# Patient Record
Sex: Male | Born: 1940 | Race: White | Hispanic: No | Smoking: Former smoker
Health system: Southern US, Community
[De-identification: ages and names within clinical notes are randomized; demographics above are authoritative.]

## PROBLEM LIST (undated history)

## (undated) DIAGNOSIS — E785 Hyperlipidemia, unspecified: Secondary | ICD-10-CM

## (undated) DIAGNOSIS — F32A Depression, unspecified: Secondary | ICD-10-CM

## (undated) DIAGNOSIS — E039 Hypothyroidism, unspecified: Secondary | ICD-10-CM

## (undated) DIAGNOSIS — I10 Essential (primary) hypertension: Secondary | ICD-10-CM

## (undated) DIAGNOSIS — F419 Anxiety disorder, unspecified: Secondary | ICD-10-CM

## (undated) DIAGNOSIS — J449 Chronic obstructive pulmonary disease, unspecified: Secondary | ICD-10-CM

## (undated) DIAGNOSIS — Z8673 Personal history of transient ischemic attack (TIA), and cerebral infarction without residual deficits: Secondary | ICD-10-CM

## (undated) DIAGNOSIS — F329 Major depressive disorder, single episode, unspecified: Secondary | ICD-10-CM

## (undated) DIAGNOSIS — N400 Enlarged prostate without lower urinary tract symptoms: Secondary | ICD-10-CM

## (undated) DIAGNOSIS — E119 Type 2 diabetes mellitus without complications: Secondary | ICD-10-CM

## (undated) DIAGNOSIS — Z794 Long term (current) use of insulin: Secondary | ICD-10-CM

---

## 2005-03-01 ENCOUNTER — Ambulatory Visit: Payer: Self-pay | Admitting: Internal Medicine

## 2006-01-05 ENCOUNTER — Ambulatory Visit: Payer: Self-pay | Admitting: Internal Medicine

## 2006-01-17 ENCOUNTER — Ambulatory Visit: Payer: Self-pay | Admitting: *Deleted

## 2006-01-30 ENCOUNTER — Ambulatory Visit: Payer: Self-pay | Admitting: Internal Medicine

## 2006-02-07 ENCOUNTER — Ambulatory Visit: Payer: Self-pay | Admitting: *Deleted

## 2006-03-07 ENCOUNTER — Ambulatory Visit: Payer: Self-pay | Admitting: *Deleted

## 2009-10-31 ENCOUNTER — Inpatient Hospital Stay: Payer: Self-pay | Admitting: Internal Medicine

## 2009-11-08 ENCOUNTER — Encounter: Payer: Self-pay | Admitting: Internal Medicine

## 2009-12-02 ENCOUNTER — Encounter: Payer: Self-pay | Admitting: Internal Medicine

## 2009-12-05 ENCOUNTER — Ambulatory Visit: Payer: Self-pay | Admitting: Internal Medicine

## 2010-03-26 ENCOUNTER — Encounter: Payer: Self-pay | Admitting: Internal Medicine

## 2010-04-01 ENCOUNTER — Encounter: Payer: Self-pay | Admitting: Internal Medicine

## 2010-05-01 ENCOUNTER — Encounter: Payer: Self-pay | Admitting: Internal Medicine

## 2010-05-25 ENCOUNTER — Ambulatory Visit: Payer: Self-pay | Admitting: Internal Medicine

## 2010-06-01 ENCOUNTER — Ambulatory Visit: Payer: Self-pay | Admitting: Vascular Surgery

## 2010-06-02 ENCOUNTER — Encounter: Payer: Self-pay | Admitting: Internal Medicine

## 2010-06-10 ENCOUNTER — Ambulatory Visit: Payer: Self-pay | Admitting: Internal Medicine

## 2010-06-11 ENCOUNTER — Ambulatory Visit: Payer: Self-pay | Admitting: Internal Medicine

## 2010-06-25 ENCOUNTER — Ambulatory Visit: Payer: Self-pay | Admitting: Internal Medicine

## 2010-07-02 ENCOUNTER — Encounter: Payer: Self-pay | Admitting: Internal Medicine

## 2010-07-09 ENCOUNTER — Ambulatory Visit: Payer: Self-pay | Admitting: Internal Medicine

## 2010-07-23 ENCOUNTER — Ambulatory Visit: Payer: Self-pay | Admitting: Internal Medicine

## 2010-08-03 ENCOUNTER — Ambulatory Visit: Payer: Self-pay | Admitting: Internal Medicine

## 2010-08-04 ENCOUNTER — Encounter: Payer: Self-pay | Admitting: Internal Medicine

## 2010-08-10 ENCOUNTER — Ambulatory Visit: Payer: Self-pay | Admitting: Internal Medicine

## 2010-08-17 ENCOUNTER — Ambulatory Visit: Payer: Self-pay | Admitting: Internal Medicine

## 2010-08-27 ENCOUNTER — Ambulatory Visit: Payer: Self-pay | Admitting: Internal Medicine

## 2010-09-01 ENCOUNTER — Encounter: Payer: Self-pay | Admitting: Internal Medicine

## 2010-09-17 ENCOUNTER — Ambulatory Visit: Payer: Self-pay | Admitting: Internal Medicine

## 2010-10-01 ENCOUNTER — Encounter: Payer: Self-pay | Admitting: Internal Medicine

## 2010-10-08 ENCOUNTER — Encounter: Payer: Self-pay | Admitting: Internal Medicine

## 2010-11-01 ENCOUNTER — Encounter: Payer: Self-pay | Admitting: Internal Medicine

## 2010-12-02 ENCOUNTER — Encounter: Payer: Self-pay | Admitting: Internal Medicine

## 2010-12-31 ENCOUNTER — Encounter: Payer: Self-pay | Admitting: Internal Medicine

## 2011-01-31 ENCOUNTER — Encounter: Payer: Self-pay | Admitting: Internal Medicine

## 2011-03-02 ENCOUNTER — Encounter: Payer: Self-pay | Admitting: Internal Medicine

## 2011-04-02 ENCOUNTER — Encounter: Payer: Self-pay | Admitting: Internal Medicine

## 2011-05-02 ENCOUNTER — Encounter: Payer: Self-pay | Admitting: Internal Medicine

## 2011-06-02 ENCOUNTER — Encounter: Payer: Self-pay | Admitting: Internal Medicine

## 2011-07-03 ENCOUNTER — Encounter: Payer: Self-pay | Admitting: Internal Medicine

## 2011-08-02 ENCOUNTER — Encounter: Payer: Self-pay | Admitting: Internal Medicine

## 2011-09-02 ENCOUNTER — Encounter: Payer: Self-pay | Admitting: Internal Medicine

## 2011-10-02 ENCOUNTER — Encounter: Payer: Self-pay | Admitting: Internal Medicine

## 2011-11-02 ENCOUNTER — Encounter: Payer: Self-pay | Admitting: Internal Medicine

## 2011-12-03 ENCOUNTER — Encounter: Payer: Self-pay | Admitting: Internal Medicine

## 2011-12-31 ENCOUNTER — Encounter: Payer: Self-pay | Admitting: Internal Medicine

## 2012-01-31 ENCOUNTER — Encounter: Payer: Self-pay | Admitting: Internal Medicine

## 2012-03-01 ENCOUNTER — Encounter: Payer: Self-pay | Admitting: Internal Medicine

## 2012-04-01 ENCOUNTER — Encounter: Payer: Self-pay | Admitting: Internal Medicine

## 2012-04-11 LAB — BASIC METABOLIC PANEL
Anion Gap: 4 — ABNORMAL LOW (ref 7–16)
BUN: 12 mg/dL (ref 7–18)
Calcium, Total: 8.9 mg/dL (ref 8.5–10.1)
Chloride: 100 mmol/L (ref 98–107)
Co2: 33 mmol/L — ABNORMAL HIGH (ref 21–32)
EGFR (Non-African Amer.): >60
Glucose: 120 mg/dL — ABNORMAL HIGH (ref 65–99)
Osmolality: 275 (ref 275–301)
Potassium: 3.9 mmol/L (ref 3.5–5.1)

## 2012-04-11 LAB — HEPATIC FUNCTION PANEL A (ARMC)
Albumin: 3.6 g/dL (ref 3.4–5.0)
Alkaline Phosphatase: 88 U/L (ref 50–136)
Bilirubin, Direct: 0.1 mg/dL (ref 0.00–0.20)
Bilirubin,Total: 0.6 mg/dL (ref 0.2–1.0)
SGOT(AST): 27 U/L (ref 15–37)

## 2012-04-11 LAB — LIPID PANEL
Cholesterol: 140 mg/dL (ref 0–200)
HDL Cholesterol: 24 mg/dL — ABNORMAL LOW (ref 40–60)
VLDL Cholesterol, Calc: 44 mg/dL — ABNORMAL HIGH (ref 5–40)

## 2012-04-12 LAB — URINALYSIS, COMPLETE
Hyaline Cast: 31
Leukocyte Esterase: NEGATIVE
Nitrite: NEGATIVE
Protein: 100
WBC UR: 7 /HPF (ref 0–5)

## 2012-04-14 LAB — URINE CULTURE

## 2012-05-01 ENCOUNTER — Encounter: Payer: Self-pay | Admitting: Internal Medicine

## 2012-06-01 ENCOUNTER — Encounter: Payer: Self-pay | Admitting: Internal Medicine

## 2012-07-02 ENCOUNTER — Encounter: Payer: Self-pay | Admitting: Internal Medicine

## 2012-08-01 ENCOUNTER — Encounter: Payer: Self-pay | Admitting: Internal Medicine

## 2012-09-01 ENCOUNTER — Encounter: Payer: Self-pay | Admitting: Internal Medicine

## 2012-10-01 ENCOUNTER — Encounter: Payer: Self-pay | Admitting: Internal Medicine

## 2012-10-10 LAB — HEMOGLOBIN A1C: Hemoglobin A1C: 7.9 % — ABNORMAL HIGH (ref 4.2–6.3)

## 2012-10-10 LAB — BASIC METABOLIC PANEL
Calcium, Total: 8.4 mg/dL — ABNORMAL LOW (ref 8.5–10.1)
Co2: 29 mmol/L (ref 21–32)
EGFR (African American): >60
EGFR (Non-African Amer.): >60
Glucose: 158 mg/dL — ABNORMAL HIGH (ref 65–99)
Osmolality: 282 (ref 275–301)
Sodium: 139 mmol/L (ref 136–145)

## 2012-10-10 LAB — LIPID PANEL
HDL Cholesterol: 19 mg/dL — ABNORMAL LOW (ref 40–60)
Triglycerides: 200 mg/dL (ref 0–200)

## 2012-11-01 ENCOUNTER — Encounter: Payer: Self-pay | Admitting: Internal Medicine

## 2012-12-02 ENCOUNTER — Encounter: Payer: Self-pay | Admitting: Internal Medicine

## 2012-12-30 ENCOUNTER — Encounter: Payer: Self-pay | Admitting: Internal Medicine

## 2013-01-25 LAB — HEPATIC FUNCTION PANEL A (ARMC)
Albumin: 3.1 g/dL — ABNORMAL LOW (ref 3.4–5.0)
Alkaline Phosphatase: 80 U/L (ref 50–136)
Bilirubin, Direct: <0.1 mg/dL (ref 0.00–0.20)
SGOT(AST): 30 U/L (ref 15–37)
SGPT (ALT): 35 U/L (ref 12–78)

## 2013-01-25 LAB — LIPID PANEL
Cholesterol: 140 mg/dL (ref 0–200)
HDL Cholesterol: 21 mg/dL — ABNORMAL LOW (ref 40–60)
Ldl Cholesterol, Calc: 69 mg/dL (ref 0–100)

## 2013-01-25 LAB — BASIC METABOLIC PANEL
BUN: 13 mg/dL (ref 7–18)
Chloride: 100 mmol/L (ref 98–107)
Creatinine: 0.87 mg/dL (ref 0.60–1.30)
Glucose: 198 mg/dL — ABNORMAL HIGH (ref 65–99)

## 2013-01-30 ENCOUNTER — Encounter: Payer: Self-pay | Admitting: Internal Medicine

## 2013-03-01 ENCOUNTER — Encounter: Payer: Self-pay | Admitting: Internal Medicine

## 2013-04-01 ENCOUNTER — Encounter: Payer: Self-pay | Admitting: Internal Medicine

## 2013-04-26 LAB — LIPID PANEL
HDL Cholesterol: 24 mg/dL — ABNORMAL LOW (ref 40–60)
Ldl Cholesterol, Calc: 81 mg/dL (ref 0–100)
Triglycerides: 155 mg/dL (ref 0–200)

## 2013-04-26 LAB — SGOT (AST)(ARMC): SGOT(AST): 19 U/L (ref 15–37)

## 2013-05-01 ENCOUNTER — Encounter: Payer: Self-pay | Admitting: Internal Medicine

## 2013-06-01 ENCOUNTER — Encounter: Payer: Self-pay | Admitting: Internal Medicine

## 2013-07-02 ENCOUNTER — Encounter: Payer: Self-pay | Admitting: Internal Medicine

## 2013-07-12 LAB — BASIC METABOLIC PANEL
Anion Gap: 6 — ABNORMAL LOW (ref 7–16)
BUN: 17 mg/dL (ref 7–18)
Calcium, Total: 9 mg/dL (ref 8.5–10.1)
Chloride: 99 mmol/L (ref 98–107)
Co2: 29 mmol/L (ref 21–32)
EGFR (African American): >60
Potassium: 4 mmol/L (ref 3.5–5.1)
Sodium: 134 mmol/L — ABNORMAL LOW (ref 136–145)

## 2013-08-01 ENCOUNTER — Encounter: Payer: Self-pay | Admitting: Internal Medicine

## 2013-09-01 ENCOUNTER — Encounter: Payer: Self-pay | Admitting: Internal Medicine

## 2013-10-01 ENCOUNTER — Encounter: Payer: Self-pay | Admitting: Internal Medicine

## 2013-10-29 LAB — URINALYSIS, COMPLETE
Bilirubin,UR: NEGATIVE
Hyaline Cast: 3
Ketone: NEGATIVE
Nitrite: NEGATIVE
Protein: 100
WBC UR: 3 /HPF (ref 0–5)

## 2013-10-31 LAB — URINE CULTURE

## 2013-11-01 ENCOUNTER — Encounter: Payer: Self-pay | Admitting: Internal Medicine

## 2013-11-08 LAB — LIPID PANEL
Cholesterol: 159 mg/dL (ref 0–200)
HDL Cholesterol: 25 mg/dL — ABNORMAL LOW (ref 40–60)
LDL CHOLESTEROL, CALC: 92 mg/dL (ref 0–100)
TRIGLYCERIDES: 211 mg/dL — AB (ref 0–200)
VLDL Cholesterol, Calc: 42 mg/dL — ABNORMAL HIGH (ref 5–40)

## 2013-11-08 LAB — BASIC METABOLIC PANEL
Anion Gap: 1 — ABNORMAL LOW (ref 7–16)
BUN: 15 mg/dL (ref 7–18)
CALCIUM: 9 mg/dL (ref 8.5–10.1)
CHLORIDE: 100 mmol/L (ref 98–107)
Co2: 31 mmol/L (ref 21–32)
Creatinine: 0.96 mg/dL (ref 0.60–1.30)
EGFR (African American): >60
EGFR (Non-African Amer.): >60
GLUCOSE: 175 mg/dL — AB (ref 65–99)
OSMOLALITY: 270 (ref 275–301)
Potassium: 4.4 mmol/L (ref 3.5–5.1)
Sodium: 132 mmol/L — ABNORMAL LOW (ref 136–145)

## 2013-11-08 LAB — HEMOGLOBIN A1C: HEMOGLOBIN A1C: 7.5 % — AB (ref 4.2–6.3)

## 2013-12-02 ENCOUNTER — Encounter: Payer: Self-pay | Admitting: Internal Medicine

## 2013-12-30 ENCOUNTER — Encounter: Payer: Self-pay | Admitting: Internal Medicine

## 2014-01-30 ENCOUNTER — Encounter: Payer: Self-pay | Admitting: Internal Medicine

## 2014-03-01 ENCOUNTER — Encounter: Payer: Self-pay | Admitting: Internal Medicine

## 2014-04-01 ENCOUNTER — Encounter: Payer: Self-pay | Admitting: Internal Medicine

## 2014-05-01 ENCOUNTER — Encounter: Payer: Self-pay | Admitting: Internal Medicine

## 2014-05-09 LAB — BASIC METABOLIC PANEL
Anion Gap: 7 (ref 7–16)
BUN: 15 mg/dL (ref 7–18)
CHLORIDE: 98 mmol/L (ref 98–107)
CREATININE: 1.03 mg/dL (ref 0.60–1.30)
Calcium, Total: 8.8 mg/dL (ref 8.5–10.1)
Co2: 31 mmol/L (ref 21–32)
EGFR (African American): >60
EGFR (Non-African Amer.): >60
Glucose: 194 mg/dL — ABNORMAL HIGH (ref 65–99)
OSMOLALITY: 278 (ref 275–301)
Potassium: 3.9 mmol/L (ref 3.5–5.1)
Sodium: 136 mmol/L (ref 136–145)

## 2014-05-09 LAB — LIPID PANEL
Cholesterol: 154 mg/dL (ref 0–200)
HDL: 21 mg/dL — AB (ref 40–60)
Ldl Cholesterol, Calc: 93 mg/dL (ref 0–100)
Triglycerides: 199 mg/dL (ref 0–200)
VLDL Cholesterol, Calc: 40 mg/dL (ref 5–40)

## 2014-05-09 LAB — HEMOGLOBIN A1C: Hemoglobin A1C: 7.7 % — ABNORMAL HIGH (ref 4.2–6.3)

## 2014-05-14 LAB — BASIC METABOLIC PANEL
ANION GAP: 6 — AB (ref 7–16)
BUN: 14 mg/dL (ref 7–18)
CALCIUM: 8.5 mg/dL (ref 8.5–10.1)
CHLORIDE: 101 mmol/L (ref 98–107)
CREATININE: 0.99 mg/dL (ref 0.60–1.30)
Co2: 29 mmol/L (ref 21–32)
EGFR (African American): >60
EGFR (Non-African Amer.): >60
GLUCOSE: 182 mg/dL — AB (ref 65–99)
Osmolality: 277 (ref 275–301)
Potassium: 3.9 mmol/L (ref 3.5–5.1)
SODIUM: 136 mmol/L (ref 136–145)

## 2014-05-14 LAB — LIPID PANEL
Cholesterol: 139 mg/dL (ref 0–200)
HDL: 21 mg/dL — AB (ref 40–60)
Ldl Cholesterol, Calc: 83 mg/dL (ref 0–100)
Triglycerides: 173 mg/dL (ref 0–200)
VLDL Cholesterol, Calc: 35 mg/dL (ref 5–40)

## 2014-05-14 LAB — HEMOGLOBIN A1C: HEMOGLOBIN A1C: 8.3 % — AB (ref 4.2–6.3)

## 2014-06-01 ENCOUNTER — Encounter: Payer: Self-pay | Admitting: Internal Medicine

## 2014-07-02 ENCOUNTER — Encounter: Payer: Self-pay | Admitting: Internal Medicine

## 2014-08-01 ENCOUNTER — Encounter: Payer: Self-pay | Admitting: Internal Medicine

## 2014-09-01 ENCOUNTER — Encounter: Payer: Self-pay | Admitting: Internal Medicine

## 2014-10-01 ENCOUNTER — Encounter: Payer: Self-pay | Admitting: Internal Medicine

## 2014-11-01 ENCOUNTER — Encounter: Payer: Self-pay | Admitting: Internal Medicine

## 2014-11-14 LAB — COMPREHENSIVE METABOLIC PANEL
Albumin: 2.9 g/dL — ABNORMAL LOW (ref 3.4–5.0)
Alkaline Phosphatase: 72 U/L
Anion Gap: 6 — ABNORMAL LOW (ref 7–16)
BILIRUBIN TOTAL: 0.5 mg/dL (ref 0.2–1.0)
BUN: 19 mg/dL — ABNORMAL HIGH (ref 7–18)
CALCIUM: 8.5 mg/dL (ref 8.5–10.1)
CHLORIDE: 104 mmol/L (ref 98–107)
CO2: 29 mmol/L (ref 21–32)
CREATININE: 1.14 mg/dL (ref 0.60–1.30)
EGFR (African American): >60
EGFR (Non-African Amer.): >60
Glucose: 171 mg/dL — ABNORMAL HIGH (ref 65–99)
OSMOLALITY: 284 (ref 275–301)
POTASSIUM: 3.8 mmol/L (ref 3.5–5.1)
SGOT(AST): 56 U/L — ABNORMAL HIGH (ref 15–37)
SGPT (ALT): 40 U/L
SODIUM: 139 mmol/L (ref 136–145)
TOTAL PROTEIN: 6.5 g/dL (ref 6.4–8.2)

## 2014-11-14 LAB — CBC WITH DIFFERENTIAL/PLATELET
BASOS PCT: 0.7 %
Basophil #: 0 10*3/uL (ref 0.0–0.1)
EOS PCT: 7.2 %
Eosinophil #: 0.5 10*3/uL (ref 0.0–0.7)
HCT: 41.4 % (ref 40.0–52.0)
HGB: 13.5 g/dL (ref 13.0–18.0)
LYMPHS PCT: 20.1 %
Lymphocyte #: 1.4 10*3/uL (ref 1.0–3.6)
MCH: 27.3 pg (ref 26.0–34.0)
MCHC: 32.5 g/dL (ref 32.0–36.0)
MCV: 84 fL (ref 80–100)
MONOS PCT: 5 %
Monocyte #: 0.3 x10 3/mm (ref 0.2–1.0)
Neutrophil #: 4.5 10*3/uL (ref 1.4–6.5)
Neutrophil %: 67 %
Platelet: 185 10*3/uL (ref 150–440)
RBC: 4.94 10*6/uL (ref 4.40–5.90)
RDW: 14.1 % (ref 11.5–14.5)
WBC: 6.7 10*3/uL (ref 3.8–10.6)

## 2014-11-14 LAB — TSH: Thyroid Stimulating Horm: 3.52 u[IU]/mL

## 2014-11-14 LAB — LIPID PANEL
Cholesterol: 128 mg/dL (ref 0–200)
HDL: 26 mg/dL — AB (ref 40–60)
LDL CHOLESTEROL, CALC: 82 mg/dL (ref 0–100)
Triglycerides: 102 mg/dL (ref 0–200)
VLDL Cholesterol, Calc: 20 mg/dL (ref 5–40)

## 2014-11-14 LAB — HEMOGLOBIN A1C: Hemoglobin A1C: 7.5 % — ABNORMAL HIGH (ref 4.2–6.3)

## 2014-12-02 ENCOUNTER — Encounter: Payer: Self-pay | Admitting: Internal Medicine

## 2014-12-31 ENCOUNTER — Encounter
Admit: 2014-12-31 | Disposition: A | Discharge status: Home / Self Care | Payer: Self-pay | Attending: Internal Medicine | Admitting: Internal Medicine

## 2015-01-31 ENCOUNTER — Encounter
Admit: 2015-01-31 | Disposition: A | Discharge status: Home / Self Care | Payer: Self-pay | Attending: Internal Medicine | Admitting: Internal Medicine

## 2015-02-11 LAB — COMPREHENSIVE METABOLIC PANEL
AST: 30 U/L
Albumin: 3.4 g/dL — ABNORMAL LOW
Alkaline Phosphatase: 50 U/L
Anion Gap: 4 — ABNORMAL LOW (ref 7–16)
BUN: 20 mg/dL
Bilirubin,Total: 0.5 mg/dL
CALCIUM: 8.4 mg/dL — AB
CHLORIDE: 106 mmol/L
CREATININE: 1.2 mg/dL
Co2: 29 mmol/L
EGFR (African American): >60
GFR CALC NON AF AMER: 59 — AB
GLUCOSE: 140 mg/dL — AB
POTASSIUM: 4 mmol/L
SGPT (ALT): 19 U/L
SODIUM: 139 mmol/L
Total Protein: 6.4 g/dL — ABNORMAL LOW

## 2015-02-11 LAB — CBC WITH DIFFERENTIAL/PLATELET
Basophil #: 0 10*3/uL (ref 0.0–0.1)
Basophil %: 0.5 %
EOS ABS: 0.7 10*3/uL (ref 0.0–0.7)
Eosinophil %: 11.3 %
HCT: 37.3 % — ABNORMAL LOW (ref 40.0–52.0)
HGB: 12.2 g/dL — AB (ref 13.0–18.0)
Lymphocyte #: 1.7 10*3/uL (ref 1.0–3.6)
Lymphocyte %: 27.4 %
MCH: 25.9 pg — AB (ref 26.0–34.0)
MCHC: 32.7 g/dL (ref 32.0–36.0)
MCV: 79 fL — ABNORMAL LOW (ref 80–100)
Monocyte #: 0.4 x10 3/mm (ref 0.2–1.0)
Monocyte %: 5.7 %
NEUTROS ABS: 3.4 10*3/uL (ref 1.4–6.5)
NEUTROS PCT: 55.1 %
Platelet: 190 10*3/uL (ref 150–440)
RBC: 4.71 10*6/uL (ref 4.40–5.90)
RDW: 14 % (ref 11.5–14.5)
WBC: 6.2 10*3/uL (ref 3.8–10.6)

## 2015-02-11 LAB — TSH: Thyroid Stimulating Horm: 5.337 u[IU]/mL — ABNORMAL HIGH

## 2015-02-18 LAB — BASIC METABOLIC PANEL
ANION GAP: 6 — AB (ref 7–16)
BUN: 22 mg/dL — ABNORMAL HIGH
CALCIUM: 7.7 mg/dL — AB
Chloride: 103 mmol/L
Co2: 28 mmol/L
Creatinine: 1.23 mg/dL
EGFR (African American): >60
GFR CALC NON AF AMER: 57 — AB
Glucose: 191 mg/dL — ABNORMAL HIGH
Potassium: 4.1 mmol/L
SODIUM: 137 mmol/L

## 2015-03-02 ENCOUNTER — Encounter
Admission: RE | Admit: 2015-03-02 | Discharge: 2015-03-02 | Disposition: A | Discharge status: Home / Self Care | Payer: Medicare Other | Source: Ambulatory Visit | Attending: Internal Medicine | Admitting: Internal Medicine

## 2015-03-02 DIAGNOSIS — D649 Anemia, unspecified: Secondary | ICD-10-CM | POA: Insufficient documentation

## 2015-03-02 DIAGNOSIS — E039 Hypothyroidism, unspecified: Secondary | ICD-10-CM | POA: Insufficient documentation

## 2015-03-27 DIAGNOSIS — E039 Hypothyroidism, unspecified: Secondary | ICD-10-CM | POA: Diagnosis not present

## 2015-03-27 DIAGNOSIS — D649 Anemia, unspecified: Secondary | ICD-10-CM | POA: Diagnosis not present

## 2015-03-27 LAB — CBC WITH DIFFERENTIAL/PLATELET
BASOS ABS: 0.1 10*3/uL (ref 0–0.1)
BASOS PCT: 1 %
EOS ABS: 0.8 10*3/uL — AB (ref 0–0.7)
EOS PCT: 11 %
HCT: 39.2 % — ABNORMAL LOW (ref 40.0–52.0)
HEMOGLOBIN: 12.8 g/dL — AB (ref 13.0–18.0)
LYMPHS ABS: 1.8 10*3/uL (ref 1.0–3.6)
LYMPHS PCT: 28 %
MCH: 25.5 pg — AB (ref 26.0–34.0)
MCHC: 32.7 g/dL (ref 32.0–36.0)
MCV: 78 fL — ABNORMAL LOW (ref 80.0–100.0)
Monocytes Absolute: 0.3 10*3/uL (ref 0.2–1.0)
Monocytes Relative: 5 %
NEUTROS PCT: 55 %
Neutro Abs: 3.6 10*3/uL (ref 1.4–6.5)
PLATELETS: 169 10*3/uL (ref 150–440)
RBC: 5.03 MIL/uL (ref 4.40–5.90)
RDW: 14.8 % — ABNORMAL HIGH (ref 11.5–14.5)
WBC: 6.6 10*3/uL (ref 3.8–10.6)

## 2015-03-27 LAB — TSH: TSH: 3.454 u[IU]/mL (ref 0.350–4.500)

## 2015-04-02 ENCOUNTER — Encounter
Admission: RE | Admit: 2015-04-02 | Discharge: 2015-04-02 | Disposition: A | Discharge status: Home / Self Care | Payer: Medicare Other | Source: Ambulatory Visit | Attending: Internal Medicine | Admitting: Internal Medicine

## 2015-05-02 ENCOUNTER — Encounter
Admission: RE | Admit: 2015-05-02 | Discharge: 2015-05-02 | Disposition: A | Discharge status: Home / Self Care | Payer: Medicare Other | Source: Ambulatory Visit | Attending: Internal Medicine | Admitting: Internal Medicine

## 2015-05-02 DIAGNOSIS — E039 Hypothyroidism, unspecified: Secondary | ICD-10-CM | POA: Insufficient documentation

## 2015-05-02 DIAGNOSIS — D649 Anemia, unspecified: Secondary | ICD-10-CM | POA: Insufficient documentation

## 2015-06-02 ENCOUNTER — Encounter
Admission: RE | Admit: 2015-06-02 | Discharge: 2015-06-02 | Disposition: A | Discharge status: Home / Self Care | Payer: Medicare Other | Source: Ambulatory Visit | Attending: Internal Medicine | Admitting: Internal Medicine

## 2015-06-19 LAB — GLUCOSE, CAPILLARY: GLUCOSE-CAPILLARY: 221 mg/dL — AB (ref 65–99)

## 2015-06-20 LAB — GLUCOSE, CAPILLARY: Glucose-Capillary: 276 mg/dL — ABNORMAL HIGH (ref 65–99)

## 2015-06-24 LAB — GLUCOSE, CAPILLARY: Glucose-Capillary: 186 mg/dL — ABNORMAL HIGH (ref 65–99)

## 2015-06-25 LAB — GLUCOSE, CAPILLARY: GLUCOSE-CAPILLARY: 338 mg/dL — AB (ref 65–99)

## 2015-06-28 LAB — GLUCOSE, CAPILLARY: GLUCOSE-CAPILLARY: 281 mg/dL — AB (ref 65–99)

## 2015-06-29 LAB — GLUCOSE, CAPILLARY: GLUCOSE-CAPILLARY: 296 mg/dL — AB (ref 65–99)

## 2015-06-30 LAB — GLUCOSE, CAPILLARY
Glucose-Capillary: 154 mg/dL — ABNORMAL HIGH (ref 65–99)
Glucose-Capillary: 314 mg/dL — ABNORMAL HIGH (ref 65–99)

## 2015-07-02 LAB — GLUCOSE, CAPILLARY
Glucose-Capillary: 154 mg/dL — ABNORMAL HIGH (ref 65–99)
Glucose-Capillary: 275 mg/dL — ABNORMAL HIGH (ref 65–99)

## 2015-07-03 ENCOUNTER — Encounter
Admission: RE | Admit: 2015-07-03 | Discharge: 2015-07-03 | Disposition: A | Discharge status: Home / Self Care | Payer: Medicare Other | Source: Ambulatory Visit | Attending: Internal Medicine | Admitting: Internal Medicine

## 2015-07-03 DIAGNOSIS — D649 Anemia, unspecified: Secondary | ICD-10-CM | POA: Insufficient documentation

## 2015-07-03 DIAGNOSIS — E039 Hypothyroidism, unspecified: Secondary | ICD-10-CM | POA: Insufficient documentation

## 2015-07-03 LAB — GLUCOSE, CAPILLARY: Glucose-Capillary: 191 mg/dL — ABNORMAL HIGH (ref 65–99)

## 2015-07-04 LAB — GLUCOSE, CAPILLARY
Glucose-Capillary: 198 mg/dL — ABNORMAL HIGH (ref 65–99)
Glucose-Capillary: 416 mg/dL — ABNORMAL HIGH (ref 65–99)

## 2015-07-05 LAB — GLUCOSE, CAPILLARY: Glucose-Capillary: 295 mg/dL — ABNORMAL HIGH (ref 65–99)

## 2015-07-06 LAB — GLUCOSE, CAPILLARY: Glucose-Capillary: 315 mg/dL — ABNORMAL HIGH (ref 65–99)

## 2015-07-07 LAB — GLUCOSE, CAPILLARY: Glucose-Capillary: 364 mg/dL — ABNORMAL HIGH (ref 65–99)

## 2015-07-08 LAB — GLUCOSE, CAPILLARY
Glucose-Capillary: 333 mg/dL — ABNORMAL HIGH (ref 65–99)
Glucose-Capillary: 354 mg/dL — ABNORMAL HIGH (ref 65–99)

## 2015-07-09 LAB — GLUCOSE, CAPILLARY
GLUCOSE-CAPILLARY: 308 mg/dL — AB (ref 65–99)
Glucose-Capillary: 276 mg/dL — ABNORMAL HIGH (ref 65–99)

## 2015-07-10 LAB — GLUCOSE, CAPILLARY: Glucose-Capillary: 282 mg/dL — ABNORMAL HIGH (ref 65–99)

## 2015-07-11 LAB — GLUCOSE, CAPILLARY: GLUCOSE-CAPILLARY: 356 mg/dL — AB (ref 65–99)

## 2015-07-12 LAB — GLUCOSE, CAPILLARY
GLUCOSE-CAPILLARY: 227 mg/dL — AB (ref 65–99)
GLUCOSE-CAPILLARY: 122 mg/dL — AB (ref 65–99)

## 2015-07-13 LAB — GLUCOSE, CAPILLARY
Glucose-Capillary: 309 mg/dL — ABNORMAL HIGH (ref 65–99)
Glucose-Capillary: 242 mg/dL — ABNORMAL HIGH (ref 65–99)

## 2015-07-14 LAB — GLUCOSE, CAPILLARY
GLUCOSE-CAPILLARY: 205 mg/dL — AB (ref 65–99)
GLUCOSE-CAPILLARY: 351 mg/dL — AB (ref 65–99)

## 2015-07-15 LAB — GLUCOSE, CAPILLARY: GLUCOSE-CAPILLARY: 291 mg/dL — AB (ref 65–99)

## 2015-07-16 LAB — GLUCOSE, CAPILLARY: GLUCOSE-CAPILLARY: 323 mg/dL — AB (ref 65–99)

## 2015-07-17 LAB — GLUCOSE, CAPILLARY
GLUCOSE-CAPILLARY: 237 mg/dL — AB (ref 65–99)
GLUCOSE-CAPILLARY: 282 mg/dL — AB (ref 65–99)

## 2015-07-18 LAB — GLUCOSE, CAPILLARY
GLUCOSE-CAPILLARY: 248 mg/dL — AB (ref 65–99)
Glucose-Capillary: 295 mg/dL — ABNORMAL HIGH (ref 65–99)

## 2015-07-19 LAB — GLUCOSE, CAPILLARY: GLUCOSE-CAPILLARY: 255 mg/dL — AB (ref 65–99)

## 2015-07-20 LAB — GLUCOSE, CAPILLARY: Glucose-Capillary: 329 mg/dL — ABNORMAL HIGH (ref 65–99)

## 2015-07-22 LAB — GLUCOSE, CAPILLARY
GLUCOSE-CAPILLARY: 195 mg/dL — AB (ref 65–99)
Glucose-Capillary: 155 mg/dL — ABNORMAL HIGH (ref 65–99)

## 2015-07-23 LAB — GLUCOSE, CAPILLARY: GLUCOSE-CAPILLARY: 206 mg/dL — AB (ref 65–99)

## 2015-07-24 LAB — GLUCOSE, CAPILLARY: Glucose-Capillary: 306 mg/dL — ABNORMAL HIGH (ref 65–99)

## 2015-07-27 LAB — GLUCOSE, CAPILLARY
GLUCOSE-CAPILLARY: 274 mg/dL — AB (ref 65–99)
GLUCOSE-CAPILLARY: 176 mg/dL — AB (ref 65–99)
Glucose-Capillary: 150 mg/dL — ABNORMAL HIGH (ref 65–99)
Glucose-Capillary: 180 mg/dL — ABNORMAL HIGH (ref 65–99)

## 2015-07-28 LAB — GLUCOSE, CAPILLARY
GLUCOSE-CAPILLARY: 150 mg/dL — AB (ref 65–99)
GLUCOSE-CAPILLARY: 284 mg/dL — AB (ref 65–99)

## 2015-07-29 LAB — GLUCOSE, CAPILLARY: GLUCOSE-CAPILLARY: 228 mg/dL — AB (ref 65–99)

## 2015-07-30 LAB — GLUCOSE, CAPILLARY: GLUCOSE-CAPILLARY: 320 mg/dL — AB (ref 65–99)

## 2015-07-31 LAB — GLUCOSE, CAPILLARY: Glucose-Capillary: 163 mg/dL — ABNORMAL HIGH (ref 65–99)

## 2015-08-01 LAB — GLUCOSE, CAPILLARY
GLUCOSE-CAPILLARY: 225 mg/dL — AB (ref 65–99)
GLUCOSE-CAPILLARY: 279 mg/dL — AB (ref 65–99)
Glucose-Capillary: 110 mg/dL — ABNORMAL HIGH (ref 65–99)

## 2015-08-02 ENCOUNTER — Encounter
Admission: RE | Admit: 2015-08-02 | Discharge: 2015-08-02 | Disposition: A | Discharge status: Home / Self Care | Payer: Medicare Other | Source: Ambulatory Visit | Attending: Internal Medicine | Admitting: Internal Medicine

## 2015-08-02 DIAGNOSIS — E119 Type 2 diabetes mellitus without complications: Secondary | ICD-10-CM | POA: Insufficient documentation

## 2015-08-02 DIAGNOSIS — I4891 Unspecified atrial fibrillation: Secondary | ICD-10-CM | POA: Insufficient documentation

## 2015-08-02 DIAGNOSIS — I10 Essential (primary) hypertension: Secondary | ICD-10-CM | POA: Insufficient documentation

## 2015-08-02 LAB — GLUCOSE, CAPILLARY: Glucose-Capillary: 182 mg/dL — ABNORMAL HIGH (ref 65–99)

## 2015-08-03 LAB — GLUCOSE, CAPILLARY: GLUCOSE-CAPILLARY: 210 mg/dL — AB (ref 65–99)

## 2015-08-05 LAB — GLUCOSE, CAPILLARY
GLUCOSE-CAPILLARY: 262 mg/dL — AB (ref 65–99)
GLUCOSE-CAPILLARY: 95 mg/dL (ref 65–99)
GLUCOSE-CAPILLARY: 163 mg/dL — AB (ref 65–99)

## 2015-08-06 LAB — GLUCOSE, CAPILLARY
GLUCOSE-CAPILLARY: 266 mg/dL — AB (ref 65–99)
Glucose-Capillary: 108 mg/dL — ABNORMAL HIGH (ref 65–99)

## 2015-08-07 LAB — GLUCOSE, CAPILLARY: Glucose-Capillary: 185 mg/dL — ABNORMAL HIGH (ref 65–99)

## 2015-08-08 LAB — GLUCOSE, CAPILLARY
GLUCOSE-CAPILLARY: 86 mg/dL (ref 65–99)
Glucose-Capillary: 230 mg/dL — ABNORMAL HIGH (ref 65–99)

## 2015-08-09 LAB — GLUCOSE, CAPILLARY
GLUCOSE-CAPILLARY: 89 mg/dL (ref 65–99)
Glucose-Capillary: 160 mg/dL — ABNORMAL HIGH (ref 65–99)

## 2015-08-10 LAB — GLUCOSE, CAPILLARY
Glucose-Capillary: 89 mg/dL (ref 65–99)
Glucose-Capillary: 216 mg/dL — ABNORMAL HIGH (ref 65–99)

## 2015-08-11 LAB — GLUCOSE, CAPILLARY
GLUCOSE-CAPILLARY: 183 mg/dL — AB (ref 65–99)
GLUCOSE-CAPILLARY: 292 mg/dL — AB (ref 65–99)

## 2015-08-12 DIAGNOSIS — E119 Type 2 diabetes mellitus without complications: Secondary | ICD-10-CM | POA: Diagnosis not present

## 2015-08-12 DIAGNOSIS — I10 Essential (primary) hypertension: Secondary | ICD-10-CM | POA: Diagnosis present

## 2015-08-12 DIAGNOSIS — I4891 Unspecified atrial fibrillation: Secondary | ICD-10-CM | POA: Diagnosis not present

## 2015-08-12 LAB — BASIC METABOLIC PANEL
ANION GAP: 5 (ref 5–15)
BUN: 26 mg/dL — ABNORMAL HIGH (ref 6–20)
CALCIUM: 8.8 mg/dL — AB (ref 8.9–10.3)
CO2: 29 mmol/L (ref 22–32)
Chloride: 104 mmol/L (ref 101–111)
Creatinine, Ser: 1.39 mg/dL — ABNORMAL HIGH (ref 0.61–1.24)
GFR calc non Af Amer: 48 mL/min — ABNORMAL LOW (ref >60)
GFR, EST AFRICAN AMERICAN: 56 mL/min — AB (ref >60)
Glucose, Bld: 206 mg/dL — ABNORMAL HIGH (ref 65–99)
Potassium: 4.3 mmol/L (ref 3.5–5.1)
SODIUM: 138 mmol/L (ref 135–145)

## 2015-08-12 LAB — GLUCOSE, CAPILLARY: GLUCOSE-CAPILLARY: 333 mg/dL — AB (ref 65–99)

## 2015-08-12 LAB — HEMOGLOBIN A1C: HEMOGLOBIN A1C: 8.2 % — AB (ref 4.0–6.0)

## 2015-08-13 LAB — GLUCOSE, CAPILLARY: Glucose-Capillary: 222 mg/dL — ABNORMAL HIGH (ref 65–99)

## 2015-08-14 LAB — GLUCOSE, CAPILLARY: Glucose-Capillary: 141 mg/dL — ABNORMAL HIGH (ref 65–99)

## 2015-08-15 LAB — GLUCOSE, CAPILLARY
GLUCOSE-CAPILLARY: 203 mg/dL — AB (ref 65–99)
GLUCOSE-CAPILLARY: 183 mg/dL — AB (ref 65–99)
Glucose-Capillary: 178 mg/dL — ABNORMAL HIGH (ref 65–99)

## 2015-08-16 LAB — GLUCOSE, CAPILLARY: Glucose-Capillary: 220 mg/dL — ABNORMAL HIGH (ref 65–99)

## 2015-08-17 LAB — GLUCOSE, CAPILLARY: GLUCOSE-CAPILLARY: 280 mg/dL — AB (ref 65–99)

## 2015-08-18 LAB — GLUCOSE, CAPILLARY: Glucose-Capillary: 239 mg/dL — ABNORMAL HIGH (ref 65–99)

## 2015-08-19 LAB — GLUCOSE, CAPILLARY: Glucose-Capillary: 108 mg/dL — ABNORMAL HIGH (ref 65–99)

## 2015-08-20 LAB — GLUCOSE, CAPILLARY
GLUCOSE-CAPILLARY: 193 mg/dL — AB (ref 65–99)
Glucose-Capillary: 185 mg/dL — ABNORMAL HIGH (ref 65–99)

## 2015-08-21 LAB — GLUCOSE, CAPILLARY
GLUCOSE-CAPILLARY: 128 mg/dL — AB (ref 65–99)
Glucose-Capillary: 146 mg/dL — ABNORMAL HIGH (ref 65–99)

## 2015-08-23 LAB — GLUCOSE, CAPILLARY
Glucose-Capillary: 254 mg/dL — ABNORMAL HIGH (ref 65–99)
Glucose-Capillary: 102 mg/dL — ABNORMAL HIGH (ref 65–99)
Glucose-Capillary: 181 mg/dL — ABNORMAL HIGH (ref 65–99)

## 2015-08-24 LAB — GLUCOSE, CAPILLARY: GLUCOSE-CAPILLARY: 192 mg/dL — AB (ref 65–99)

## 2015-08-25 LAB — GLUCOSE, CAPILLARY
GLUCOSE-CAPILLARY: 82 mg/dL (ref 65–99)
Glucose-Capillary: 156 mg/dL — ABNORMAL HIGH (ref 65–99)

## 2015-08-26 LAB — GLUCOSE, CAPILLARY: GLUCOSE-CAPILLARY: 327 mg/dL — AB (ref 65–99)

## 2015-08-27 LAB — GLUCOSE, CAPILLARY: GLUCOSE-CAPILLARY: 273 mg/dL — AB (ref 65–99)

## 2015-08-28 LAB — GLUCOSE, CAPILLARY: Glucose-Capillary: 83 mg/dL (ref 65–99)

## 2015-08-29 LAB — GLUCOSE, CAPILLARY
Glucose-Capillary: 83 mg/dL (ref 65–99)
Glucose-Capillary: 250 mg/dL — ABNORMAL HIGH (ref 65–99)

## 2015-08-30 LAB — GLUCOSE, CAPILLARY: Glucose-Capillary: 189 mg/dL — ABNORMAL HIGH (ref 65–99)

## 2015-09-02 ENCOUNTER — Encounter
Admission: RE | Admit: 2015-09-02 | Discharge: 2015-09-02 | Disposition: A | Discharge status: Home / Self Care | Payer: Medicare Other | Source: Ambulatory Visit | Attending: Internal Medicine | Admitting: Internal Medicine

## 2015-09-02 DIAGNOSIS — I10 Essential (primary) hypertension: Secondary | ICD-10-CM | POA: Insufficient documentation

## 2015-09-02 DIAGNOSIS — I4891 Unspecified atrial fibrillation: Secondary | ICD-10-CM | POA: Insufficient documentation

## 2015-09-02 DIAGNOSIS — E119 Type 2 diabetes mellitus without complications: Secondary | ICD-10-CM | POA: Insufficient documentation

## 2015-09-03 DIAGNOSIS — I4891 Unspecified atrial fibrillation: Secondary | ICD-10-CM | POA: Diagnosis not present

## 2015-09-03 DIAGNOSIS — E119 Type 2 diabetes mellitus without complications: Secondary | ICD-10-CM | POA: Diagnosis not present

## 2015-09-03 DIAGNOSIS — I10 Essential (primary) hypertension: Secondary | ICD-10-CM | POA: Diagnosis present

## 2015-09-03 LAB — GLUCOSE, CAPILLARY: Glucose-Capillary: 209 mg/dL — ABNORMAL HIGH (ref 65–99)

## 2015-09-04 DIAGNOSIS — E119 Type 2 diabetes mellitus without complications: Secondary | ICD-10-CM | POA: Diagnosis not present

## 2015-09-04 LAB — GLUCOSE, CAPILLARY: GLUCOSE-CAPILLARY: 100 mg/dL — AB (ref 65–99)

## 2015-09-06 DIAGNOSIS — E119 Type 2 diabetes mellitus without complications: Secondary | ICD-10-CM | POA: Diagnosis not present

## 2015-09-06 LAB — GLUCOSE, CAPILLARY: Glucose-Capillary: 78 mg/dL (ref 65–99)

## 2015-09-07 LAB — GLUCOSE, CAPILLARY
GLUCOSE-CAPILLARY: 240 mg/dL — AB (ref 65–99)
GLUCOSE-CAPILLARY: 120 mg/dL — AB (ref 65–99)
Glucose-Capillary: 141 mg/dL — ABNORMAL HIGH (ref 65–99)

## 2015-09-08 DIAGNOSIS — E119 Type 2 diabetes mellitus without complications: Secondary | ICD-10-CM | POA: Diagnosis not present

## 2015-09-08 LAB — GLUCOSE, CAPILLARY
GLUCOSE-CAPILLARY: 90 mg/dL (ref 65–99)
Glucose-Capillary: 77 mg/dL (ref 65–99)

## 2015-09-09 DIAGNOSIS — E119 Type 2 diabetes mellitus without complications: Secondary | ICD-10-CM | POA: Diagnosis not present

## 2015-09-09 LAB — GLUCOSE, CAPILLARY: Glucose-Capillary: 158 mg/dL — ABNORMAL HIGH (ref 65–99)

## 2015-09-11 DIAGNOSIS — E119 Type 2 diabetes mellitus without complications: Secondary | ICD-10-CM | POA: Diagnosis not present

## 2015-09-11 LAB — GLUCOSE, CAPILLARY
GLUCOSE-CAPILLARY: 165 mg/dL — AB (ref 65–99)
GLUCOSE-CAPILLARY: 62 mg/dL — AB (ref 65–99)
GLUCOSE-CAPILLARY: 54 mg/dL — AB (ref 65–99)
GLUCOSE-CAPILLARY: 83 mg/dL (ref 65–99)
Glucose-Capillary: 57 mg/dL — ABNORMAL LOW (ref 65–99)

## 2015-09-12 DIAGNOSIS — E119 Type 2 diabetes mellitus without complications: Secondary | ICD-10-CM | POA: Diagnosis not present

## 2015-09-12 LAB — GLUCOSE, CAPILLARY
Glucose-Capillary: 147 mg/dL — ABNORMAL HIGH (ref 65–99)
Glucose-Capillary: 250 mg/dL — ABNORMAL HIGH (ref 65–99)

## 2015-09-13 DIAGNOSIS — E119 Type 2 diabetes mellitus without complications: Secondary | ICD-10-CM | POA: Diagnosis not present

## 2015-09-13 LAB — GLUCOSE, CAPILLARY: GLUCOSE-CAPILLARY: 95 mg/dL (ref 65–99)

## 2015-09-14 DIAGNOSIS — E119 Type 2 diabetes mellitus without complications: Secondary | ICD-10-CM | POA: Diagnosis not present

## 2015-09-14 LAB — GLUCOSE, CAPILLARY: GLUCOSE-CAPILLARY: 105 mg/dL — AB (ref 65–99)

## 2015-09-15 DIAGNOSIS — E119 Type 2 diabetes mellitus without complications: Secondary | ICD-10-CM | POA: Diagnosis not present

## 2015-09-16 LAB — GLUCOSE, CAPILLARY
GLUCOSE-CAPILLARY: 76 mg/dL (ref 65–99)
Glucose-Capillary: 83 mg/dL (ref 65–99)
Glucose-Capillary: 247 mg/dL — ABNORMAL HIGH (ref 65–99)

## 2015-09-17 DIAGNOSIS — E119 Type 2 diabetes mellitus without complications: Secondary | ICD-10-CM | POA: Diagnosis not present

## 2015-09-17 LAB — GLUCOSE, CAPILLARY
Glucose-Capillary: 136 mg/dL — ABNORMAL HIGH (ref 65–99)
Glucose-Capillary: 221 mg/dL — ABNORMAL HIGH (ref 65–99)

## 2015-09-18 DIAGNOSIS — E119 Type 2 diabetes mellitus without complications: Secondary | ICD-10-CM | POA: Diagnosis not present

## 2015-09-18 LAB — GLUCOSE, CAPILLARY: GLUCOSE-CAPILLARY: 159 mg/dL — AB (ref 65–99)

## 2015-09-19 DIAGNOSIS — E119 Type 2 diabetes mellitus without complications: Secondary | ICD-10-CM | POA: Diagnosis not present

## 2015-09-20 DIAGNOSIS — E119 Type 2 diabetes mellitus without complications: Secondary | ICD-10-CM | POA: Diagnosis not present

## 2015-09-20 LAB — GLUCOSE, CAPILLARY
GLUCOSE-CAPILLARY: 127 mg/dL — AB (ref 65–99)
Glucose-Capillary: 184 mg/dL — ABNORMAL HIGH (ref 65–99)

## 2015-09-21 LAB — GLUCOSE, CAPILLARY
Glucose-Capillary: 167 mg/dL — ABNORMAL HIGH (ref 65–99)
Glucose-Capillary: 70 mg/dL (ref 65–99)
Glucose-Capillary: 170 mg/dL — ABNORMAL HIGH (ref 65–99)

## 2015-09-22 DIAGNOSIS — E119 Type 2 diabetes mellitus without complications: Secondary | ICD-10-CM | POA: Diagnosis not present

## 2015-09-22 LAB — GLUCOSE, CAPILLARY
Glucose-Capillary: 106 mg/dL — ABNORMAL HIGH (ref 65–99)
Glucose-Capillary: 124 mg/dL — ABNORMAL HIGH (ref 65–99)

## 2015-09-23 DIAGNOSIS — E119 Type 2 diabetes mellitus without complications: Secondary | ICD-10-CM | POA: Diagnosis not present

## 2015-09-23 LAB — GLUCOSE, CAPILLARY: Glucose-Capillary: 166 mg/dL — ABNORMAL HIGH (ref 65–99)

## 2015-09-24 DIAGNOSIS — E119 Type 2 diabetes mellitus without complications: Secondary | ICD-10-CM | POA: Diagnosis not present

## 2015-09-24 LAB — GLUCOSE, CAPILLARY: Glucose-Capillary: 262 mg/dL — ABNORMAL HIGH (ref 65–99)

## 2015-09-25 DIAGNOSIS — E119 Type 2 diabetes mellitus without complications: Secondary | ICD-10-CM | POA: Diagnosis not present

## 2015-09-25 LAB — GLUCOSE, CAPILLARY
GLUCOSE-CAPILLARY: 77 mg/dL (ref 65–99)
Glucose-Capillary: 188 mg/dL — ABNORMAL HIGH (ref 65–99)

## 2015-09-26 DIAGNOSIS — E119 Type 2 diabetes mellitus without complications: Secondary | ICD-10-CM | POA: Diagnosis not present

## 2015-09-26 LAB — GLUCOSE, CAPILLARY
Glucose-Capillary: 110 mg/dL — ABNORMAL HIGH (ref 65–99)
Glucose-Capillary: 165 mg/dL — ABNORMAL HIGH (ref 65–99)

## 2015-09-27 DIAGNOSIS — E119 Type 2 diabetes mellitus without complications: Secondary | ICD-10-CM | POA: Diagnosis not present

## 2015-09-27 LAB — GLUCOSE, CAPILLARY: GLUCOSE-CAPILLARY: 152 mg/dL — AB (ref 65–99)

## 2015-09-28 DIAGNOSIS — E119 Type 2 diabetes mellitus without complications: Secondary | ICD-10-CM | POA: Diagnosis not present

## 2015-09-28 LAB — GLUCOSE, CAPILLARY: GLUCOSE-CAPILLARY: 193 mg/dL — AB (ref 65–99)

## 2015-09-29 DIAGNOSIS — E119 Type 2 diabetes mellitus without complications: Secondary | ICD-10-CM | POA: Diagnosis not present

## 2015-09-30 DIAGNOSIS — E119 Type 2 diabetes mellitus without complications: Secondary | ICD-10-CM | POA: Diagnosis not present

## 2015-09-30 LAB — GLUCOSE, CAPILLARY
Glucose-Capillary: 166 mg/dL — ABNORMAL HIGH (ref 65–99)
Glucose-Capillary: 64 mg/dL — ABNORMAL LOW (ref 65–99)

## 2015-10-01 LAB — GLUCOSE, CAPILLARY
GLUCOSE-CAPILLARY: 112 mg/dL — AB (ref 65–99)
GLUCOSE-CAPILLARY: 12 mg/dL — AB (ref 65–99)
Glucose-Capillary: 137 mg/dL — ABNORMAL HIGH (ref 65–99)
Glucose-Capillary: 77 mg/dL (ref 65–99)

## 2015-10-02 ENCOUNTER — Encounter
Admission: RE | Admit: 2015-10-02 | Discharge: 2015-10-02 | Disposition: A | Discharge status: Home / Self Care | Payer: Medicare Other | Source: Ambulatory Visit | Attending: Internal Medicine | Admitting: Internal Medicine

## 2015-10-02 DIAGNOSIS — E119 Type 2 diabetes mellitus without complications: Secondary | ICD-10-CM | POA: Insufficient documentation

## 2015-10-02 LAB — GLUCOSE, CAPILLARY: GLUCOSE-CAPILLARY: 141 mg/dL — AB (ref 65–99)

## 2015-10-24 DIAGNOSIS — E119 Type 2 diabetes mellitus without complications: Secondary | ICD-10-CM | POA: Diagnosis not present

## 2015-10-24 LAB — GLUCOSE, CAPILLARY: GLUCOSE-CAPILLARY: 108 mg/dL — AB (ref 65–99)

## 2015-10-28 DIAGNOSIS — E119 Type 2 diabetes mellitus without complications: Secondary | ICD-10-CM | POA: Diagnosis not present

## 2015-10-28 LAB — GLUCOSE, CAPILLARY: Glucose-Capillary: 173 mg/dL — ABNORMAL HIGH (ref 65–99)

## 2015-10-29 DIAGNOSIS — E119 Type 2 diabetes mellitus without complications: Secondary | ICD-10-CM | POA: Diagnosis not present

## 2015-10-29 LAB — GLUCOSE, CAPILLARY: GLUCOSE-CAPILLARY: 132 mg/dL — AB (ref 65–99)

## 2015-11-01 DIAGNOSIS — E119 Type 2 diabetes mellitus without complications: Secondary | ICD-10-CM | POA: Diagnosis not present

## 2015-11-01 LAB — GLUCOSE, CAPILLARY: Glucose-Capillary: 172 mg/dL — ABNORMAL HIGH (ref 65–99)

## 2015-11-02 ENCOUNTER — Encounter
Admission: RE | Admit: 2015-11-02 | Discharge: 2015-11-02 | Disposition: A | Discharge status: Home / Self Care | Payer: Medicare Other | Source: Ambulatory Visit | Attending: Internal Medicine | Admitting: Internal Medicine

## 2015-11-02 DIAGNOSIS — E119 Type 2 diabetes mellitus without complications: Secondary | ICD-10-CM | POA: Insufficient documentation

## 2015-11-03 LAB — GLUCOSE, CAPILLARY: GLUCOSE-CAPILLARY: 79 mg/dL (ref 65–99)

## 2015-11-06 LAB — GLUCOSE, CAPILLARY: GLUCOSE-CAPILLARY: 160 mg/dL — AB (ref 65–99)

## 2015-11-11 LAB — GLUCOSE, CAPILLARY
Glucose-Capillary: 227 mg/dL — ABNORMAL HIGH (ref 65–99)
Glucose-Capillary: 103 mg/dL — ABNORMAL HIGH (ref 65–99)

## 2015-11-12 LAB — GLUCOSE, CAPILLARY: GLUCOSE-CAPILLARY: 178 mg/dL — AB (ref 65–99)

## 2015-11-17 LAB — GLUCOSE, CAPILLARY
Glucose-Capillary: 105 mg/dL — ABNORMAL HIGH (ref 65–99)
Glucose-Capillary: 184 mg/dL — ABNORMAL HIGH (ref 65–99)

## 2015-11-19 LAB — GLUCOSE, CAPILLARY
GLUCOSE-CAPILLARY: 255 mg/dL — AB (ref 65–99)
Glucose-Capillary: 225 mg/dL — ABNORMAL HIGH (ref 65–99)

## 2015-11-20 LAB — GLUCOSE, CAPILLARY: GLUCOSE-CAPILLARY: 195 mg/dL — AB (ref 65–99)

## 2015-11-21 LAB — GLUCOSE, CAPILLARY: GLUCOSE-CAPILLARY: 128 mg/dL — AB (ref 65–99)

## 2015-11-22 LAB — GLUCOSE, CAPILLARY: GLUCOSE-CAPILLARY: 191 mg/dL — AB (ref 65–99)

## 2015-11-23 LAB — GLUCOSE, CAPILLARY: Glucose-Capillary: 189 mg/dL — ABNORMAL HIGH (ref 65–99)

## 2015-11-24 LAB — GLUCOSE, CAPILLARY: GLUCOSE-CAPILLARY: 163 mg/dL — AB (ref 65–99)

## 2015-11-25 LAB — GLUCOSE, CAPILLARY: Glucose-Capillary: 175 mg/dL — ABNORMAL HIGH (ref 65–99)

## 2015-11-26 LAB — GLUCOSE, CAPILLARY: GLUCOSE-CAPILLARY: 80 mg/dL (ref 65–99)

## 2015-11-27 LAB — GLUCOSE, CAPILLARY: GLUCOSE-CAPILLARY: 209 mg/dL — AB (ref 65–99)

## 2015-11-28 LAB — GLUCOSE, CAPILLARY: Glucose-Capillary: 228 mg/dL — ABNORMAL HIGH (ref 65–99)

## 2015-11-29 LAB — GLUCOSE, CAPILLARY: GLUCOSE-CAPILLARY: 160 mg/dL — AB (ref 65–99)

## 2015-11-30 LAB — GLUCOSE, CAPILLARY: GLUCOSE-CAPILLARY: 199 mg/dL — AB (ref 65–99)

## 2015-12-01 LAB — GLUCOSE, CAPILLARY: Glucose-Capillary: 107 mg/dL — ABNORMAL HIGH (ref 65–99)

## 2015-12-02 LAB — GLUCOSE, CAPILLARY
Glucose-Capillary: 168 mg/dL — ABNORMAL HIGH (ref 65–99)
Glucose-Capillary: 290 mg/dL — ABNORMAL HIGH (ref 65–99)

## 2015-12-03 ENCOUNTER — Encounter
Admission: RE | Admit: 2015-12-03 | Discharge: 2015-12-03 | Disposition: A | Discharge status: Home / Self Care | Payer: Medicare Other | Source: Ambulatory Visit | Attending: Internal Medicine | Admitting: Internal Medicine

## 2015-12-05 LAB — GLUCOSE, CAPILLARY: Glucose-Capillary: 306 mg/dL — ABNORMAL HIGH (ref 65–99)

## 2015-12-06 LAB — GLUCOSE, CAPILLARY: GLUCOSE-CAPILLARY: 223 mg/dL — AB (ref 65–99)

## 2015-12-07 LAB — GLUCOSE, CAPILLARY: Glucose-Capillary: 230 mg/dL — ABNORMAL HIGH (ref 65–99)

## 2015-12-08 LAB — GLUCOSE, CAPILLARY: GLUCOSE-CAPILLARY: 158 mg/dL — AB (ref 65–99)

## 2015-12-09 LAB — GLUCOSE, CAPILLARY: GLUCOSE-CAPILLARY: 223 mg/dL — AB (ref 65–99)

## 2015-12-10 LAB — GLUCOSE, CAPILLARY: GLUCOSE-CAPILLARY: 229 mg/dL — AB (ref 65–99)

## 2015-12-11 LAB — GLUCOSE, CAPILLARY: GLUCOSE-CAPILLARY: 170 mg/dL — AB (ref 65–99)

## 2015-12-12 LAB — GLUCOSE, CAPILLARY: GLUCOSE-CAPILLARY: 162 mg/dL — AB (ref 65–99)

## 2015-12-14 LAB — GLUCOSE, CAPILLARY
GLUCOSE-CAPILLARY: 176 mg/dL — AB (ref 65–99)
Glucose-Capillary: 187 mg/dL — ABNORMAL HIGH (ref 65–99)

## 2015-12-15 LAB — GLUCOSE, CAPILLARY: GLUCOSE-CAPILLARY: 193 mg/dL — AB (ref 65–99)

## 2015-12-16 LAB — GLUCOSE, CAPILLARY: GLUCOSE-CAPILLARY: 155 mg/dL — AB (ref 65–99)

## 2015-12-17 LAB — GLUCOSE, CAPILLARY: GLUCOSE-CAPILLARY: 227 mg/dL — AB (ref 65–99)

## 2015-12-18 LAB — GLUCOSE, CAPILLARY: Glucose-Capillary: 147 mg/dL — ABNORMAL HIGH (ref 65–99)

## 2015-12-19 LAB — GLUCOSE, CAPILLARY: Glucose-Capillary: 198 mg/dL — ABNORMAL HIGH (ref 65–99)

## 2015-12-20 LAB — GLUCOSE, CAPILLARY: Glucose-Capillary: 164 mg/dL — ABNORMAL HIGH (ref 65–99)

## 2015-12-21 LAB — GLUCOSE, CAPILLARY: Glucose-Capillary: 179 mg/dL — ABNORMAL HIGH (ref 65–99)

## 2015-12-22 LAB — GLUCOSE, CAPILLARY: Glucose-Capillary: 194 mg/dL — ABNORMAL HIGH (ref 65–99)

## 2015-12-23 LAB — GLUCOSE, CAPILLARY: Glucose-Capillary: 183 mg/dL — ABNORMAL HIGH (ref 65–99)

## 2015-12-24 LAB — GLUCOSE, CAPILLARY: Glucose-Capillary: 144 mg/dL — ABNORMAL HIGH (ref 65–99)

## 2015-12-25 LAB — GLUCOSE, CAPILLARY: Glucose-Capillary: 127 mg/dL — ABNORMAL HIGH (ref 65–99)

## 2015-12-26 LAB — GLUCOSE, CAPILLARY: Glucose-Capillary: 208 mg/dL — ABNORMAL HIGH (ref 65–99)

## 2015-12-27 LAB — GLUCOSE, CAPILLARY: GLUCOSE-CAPILLARY: 215 mg/dL — AB (ref 65–99)

## 2015-12-28 LAB — GLUCOSE, CAPILLARY: Glucose-Capillary: 90 mg/dL (ref 65–99)

## 2015-12-29 LAB — GLUCOSE, CAPILLARY: Glucose-Capillary: 317 mg/dL — ABNORMAL HIGH (ref 65–99)

## 2015-12-30 LAB — GLUCOSE, CAPILLARY: GLUCOSE-CAPILLARY: 185 mg/dL — AB (ref 65–99)

## 2015-12-31 ENCOUNTER — Encounter
Admission: RE | Admit: 2015-12-31 | Discharge: 2015-12-31 | Disposition: A | Discharge status: Home / Self Care | Payer: Medicare Other | Source: Ambulatory Visit | Attending: Internal Medicine | Admitting: Internal Medicine

## 2015-12-31 LAB — GLUCOSE, CAPILLARY: Glucose-Capillary: 196 mg/dL — ABNORMAL HIGH (ref 65–99)

## 2016-01-01 ENCOUNTER — Inpatient Hospital Stay (HOSPITAL_COMMUNITY)
Admission: AD | Admit: 2016-01-01 | Discharge: 2016-01-31 | DRG: 377 | Disposition: E | Payer: Medicare Other | Source: Other Acute Inpatient Hospital | Attending: Pulmonary Disease | Admitting: Pulmonary Disease

## 2016-01-01 ENCOUNTER — Inpatient Hospital Stay (HOSPITAL_COMMUNITY): Payer: Medicare Other

## 2016-01-01 ENCOUNTER — Emergency Department
Admission: EM | Admit: 2016-01-01 | Discharge: 2016-01-01 | Disposition: A | Discharge status: Other Acute Inpatient Hospital | Payer: Medicare Other | Attending: Emergency Medicine | Admitting: Emergency Medicine

## 2016-01-01 ENCOUNTER — Emergency Department: Payer: Medicare Other

## 2016-01-01 DIAGNOSIS — Z7401 Bed confinement status: Secondary | ICD-10-CM | POA: Diagnosis not present

## 2016-01-01 DIAGNOSIS — Z515 Encounter for palliative care: Secondary | ICD-10-CM | POA: Diagnosis not present

## 2016-01-01 DIAGNOSIS — G9340 Encephalopathy, unspecified: Secondary | ICD-10-CM | POA: Diagnosis not present

## 2016-01-01 DIAGNOSIS — I69354 Hemiplegia and hemiparesis following cerebral infarction affecting left non-dominant side: Secondary | ICD-10-CM | POA: Diagnosis not present

## 2016-01-01 DIAGNOSIS — D649 Anemia, unspecified: Secondary | ICD-10-CM

## 2016-01-01 DIAGNOSIS — K921 Melena: Secondary | ICD-10-CM | POA: Diagnosis present

## 2016-01-01 DIAGNOSIS — E1122 Type 2 diabetes mellitus with diabetic chronic kidney disease: Secondary | ICD-10-CM | POA: Diagnosis present

## 2016-01-01 DIAGNOSIS — Z7902 Long term (current) use of antithrombotics/antiplatelets: Secondary | ICD-10-CM

## 2016-01-01 DIAGNOSIS — J189 Pneumonia, unspecified organism: Secondary | ICD-10-CM

## 2016-01-01 DIAGNOSIS — I129 Hypertensive chronic kidney disease with stage 1 through stage 4 chronic kidney disease, or unspecified chronic kidney disease: Secondary | ICD-10-CM | POA: Diagnosis present

## 2016-01-01 DIAGNOSIS — T380X5A Adverse effect of glucocorticoids and synthetic analogues, initial encounter: Secondary | ICD-10-CM | POA: Diagnosis present

## 2016-01-01 DIAGNOSIS — D693 Immune thrombocytopenic purpura: Secondary | ICD-10-CM | POA: Diagnosis present

## 2016-01-01 DIAGNOSIS — Y95 Nosocomial condition: Secondary | ICD-10-CM | POA: Diagnosis not present

## 2016-01-01 DIAGNOSIS — Y848 Other medical procedures as the cause of abnormal reaction of the patient, or of later complication, without mention of misadventure at the time of the procedure: Secondary | ICD-10-CM | POA: Diagnosis not present

## 2016-01-01 DIAGNOSIS — E039 Hypothyroidism, unspecified: Secondary | ICD-10-CM | POA: Diagnosis present

## 2016-01-01 DIAGNOSIS — I4891 Unspecified atrial fibrillation: Secondary | ICD-10-CM | POA: Diagnosis present

## 2016-01-01 DIAGNOSIS — I10 Essential (primary) hypertension: Secondary | ICD-10-CM | POA: Insufficient documentation

## 2016-01-01 DIAGNOSIS — Z794 Long term (current) use of insulin: Secondary | ICD-10-CM | POA: Diagnosis not present

## 2016-01-01 DIAGNOSIS — E1165 Type 2 diabetes mellitus with hyperglycemia: Secondary | ICD-10-CM | POA: Diagnosis present

## 2016-01-01 DIAGNOSIS — E119 Type 2 diabetes mellitus without complications: Secondary | ICD-10-CM | POA: Insufficient documentation

## 2016-01-01 DIAGNOSIS — Z87891 Personal history of nicotine dependence: Secondary | ICD-10-CM | POA: Diagnosis not present

## 2016-01-01 DIAGNOSIS — D696 Thrombocytopenia, unspecified: Secondary | ICD-10-CM

## 2016-01-01 DIAGNOSIS — J449 Chronic obstructive pulmonary disease, unspecified: Secondary | ICD-10-CM | POA: Diagnosis present

## 2016-01-01 DIAGNOSIS — N189 Chronic kidney disease, unspecified: Secondary | ICD-10-CM | POA: Diagnosis present

## 2016-01-01 DIAGNOSIS — K922 Gastrointestinal hemorrhage, unspecified: Secondary | ICD-10-CM | POA: Diagnosis not present

## 2016-01-01 DIAGNOSIS — D62 Acute posthemorrhagic anemia: Secondary | ICD-10-CM | POA: Diagnosis present

## 2016-01-01 DIAGNOSIS — E875 Hyperkalemia: Secondary | ICD-10-CM | POA: Diagnosis present

## 2016-01-01 DIAGNOSIS — I472 Ventricular tachycardia: Secondary | ICD-10-CM | POA: Diagnosis present

## 2016-01-01 DIAGNOSIS — I248 Other forms of acute ischemic heart disease: Secondary | ICD-10-CM | POA: Diagnosis not present

## 2016-01-01 DIAGNOSIS — J810 Acute pulmonary edema: Secondary | ICD-10-CM | POA: Diagnosis not present

## 2016-01-01 DIAGNOSIS — N19 Unspecified kidney failure: Secondary | ICD-10-CM

## 2016-01-01 DIAGNOSIS — R000 Tachycardia, unspecified: Secondary | ICD-10-CM

## 2016-01-01 DIAGNOSIS — J9601 Acute respiratory failure with hypoxia: Secondary | ICD-10-CM | POA: Diagnosis not present

## 2016-01-01 DIAGNOSIS — Z66 Do not resuscitate: Secondary | ICD-10-CM | POA: Diagnosis present

## 2016-01-01 DIAGNOSIS — E785 Hyperlipidemia, unspecified: Secondary | ICD-10-CM | POA: Diagnosis present

## 2016-01-01 DIAGNOSIS — T8092XA Unspecified transfusion reaction, initial encounter: Secondary | ICD-10-CM | POA: Diagnosis not present

## 2016-01-01 DIAGNOSIS — N179 Acute kidney failure, unspecified: Secondary | ICD-10-CM | POA: Diagnosis present

## 2016-01-01 DIAGNOSIS — R0603 Acute respiratory distress: Secondary | ICD-10-CM

## 2016-01-01 DIAGNOSIS — N400 Enlarged prostate without lower urinary tract symptoms: Secondary | ICD-10-CM | POA: Diagnosis present

## 2016-01-01 DIAGNOSIS — K920 Hematemesis: Secondary | ICD-10-CM | POA: Diagnosis present

## 2016-01-01 DIAGNOSIS — I214 Non-ST elevation (NSTEMI) myocardial infarction: Secondary | ICD-10-CM | POA: Diagnosis not present

## 2016-01-01 DIAGNOSIS — R0902 Hypoxemia: Secondary | ICD-10-CM

## 2016-01-01 DIAGNOSIS — Z8673 Personal history of transient ischemic attack (TIA), and cerebral infarction without residual deficits: Secondary | ICD-10-CM

## 2016-01-01 HISTORY — DX: Essential (primary) hypertension: I10

## 2016-01-01 HISTORY — DX: Anxiety disorder, unspecified: F41.9

## 2016-01-01 HISTORY — DX: Major depressive disorder, single episode, unspecified: F32.9

## 2016-01-01 HISTORY — DX: Long term (current) use of insulin: Z79.4

## 2016-01-01 HISTORY — DX: Hyperlipidemia, unspecified: E78.5

## 2016-01-01 HISTORY — DX: Benign prostatic hyperplasia without lower urinary tract symptoms: N40.0

## 2016-01-01 HISTORY — DX: Personal history of transient ischemic attack (TIA), and cerebral infarction without residual deficits: Z86.73

## 2016-01-01 HISTORY — DX: Type 2 diabetes mellitus without complications: E11.9

## 2016-01-01 HISTORY — DX: Depression, unspecified: F32.A

## 2016-01-01 HISTORY — DX: Chronic obstructive pulmonary disease, unspecified: J44.9

## 2016-01-01 HISTORY — DX: Hypothyroidism, unspecified: E03.9

## 2016-01-01 LAB — CBC
HCT: 21.7 % — ABNORMAL LOW (ref 40.0–52.0)
HEMATOCRIT: 31.8 % — AB (ref 39.0–52.0)
HEMOGLOBIN: 10.3 g/dL — AB (ref 13.0–17.0)
Hemoglobin: 7 g/dL — ABNORMAL LOW (ref 13.0–18.0)
MCH: 23.2 pg — AB (ref 26.0–34.0)
MCH: 25.6 pg — ABNORMAL LOW (ref 26.0–34.0)
MCHC: 32.1 g/dL (ref 32.0–36.0)
MCHC: 32.4 g/dL (ref 30.0–36.0)
MCV: 72.2 fL — AB (ref 80.0–100.0)
MCV: 79.1 fL (ref 78.0–100.0)
Platelets: <5 10*3/uL — CL (ref 150–400)
RBC: 3 MIL/uL — ABNORMAL LOW (ref 4.40–5.90)
RBC: 4.02 MIL/uL — ABNORMAL LOW (ref 4.22–5.81)
RDW: 18.1 % — ABNORMAL HIGH (ref 11.5–14.5)
RDW: 16.3 % — AB (ref 11.5–15.5)
WBC: 9.6 10*3/uL (ref 3.8–10.6)
WBC: 12.6 10*3/uL — ABNORMAL HIGH (ref 4.0–10.5)

## 2016-01-01 LAB — URINALYSIS, ROUTINE W REFLEX MICROSCOPIC
GLUCOSE, UA: 100 mg/dL — AB
KETONES UR: 15 mg/dL — AB
Leukocytes, UA: NEGATIVE
Nitrite: NEGATIVE
PH: 5 (ref 5.0–8.0)
Protein, ur: 100 mg/dL — AB
SPECIFIC GRAVITY, URINE: 1.019 (ref 1.005–1.030)

## 2016-01-01 LAB — PATHOLOGIST SMEAR REVIEW

## 2016-01-01 LAB — BASIC METABOLIC PANEL
ANION GAP: 11 (ref 5–15)
BUN: 62 mg/dL — AB (ref 6–20)
CALCIUM: 8.3 mg/dL — AB (ref 8.9–10.3)
CO2: 22 mmol/L (ref 22–32)
Chloride: 105 mmol/L (ref 101–111)
Creatinine, Ser: 2.39 mg/dL — ABNORMAL HIGH (ref 0.61–1.24)
GFR calc Af Amer: 29 mL/min — ABNORMAL LOW (ref >60)
GFR, EST NON AFRICAN AMERICAN: 25 mL/min — AB (ref >60)
Glucose, Bld: 354 mg/dL — ABNORMAL HIGH (ref 65–99)
POTASSIUM: 6.2 mmol/L — AB (ref 3.5–5.1)
SODIUM: 138 mmol/L (ref 135–145)

## 2016-01-01 LAB — COMPREHENSIVE METABOLIC PANEL
ALK PHOS: 45 U/L (ref 38–126)
ALT: 22 U/L (ref 17–63)
AST: 29 U/L (ref 15–41)
Albumin: 3.3 g/dL — ABNORMAL LOW (ref 3.5–5.0)
Anion gap: 11 (ref 5–15)
BUN: 62 mg/dL — ABNORMAL HIGH (ref 6–20)
CALCIUM: 8.7 mg/dL — AB (ref 8.9–10.3)
CHLORIDE: 98 mmol/L — AB (ref 101–111)
CO2: 26 mmol/L (ref 22–32)
CREATININE: 2.17 mg/dL — AB (ref 0.61–1.24)
GFR, EST AFRICAN AMERICAN: 32 mL/min — AB (ref >60)
GFR, EST NON AFRICAN AMERICAN: 28 mL/min — AB (ref >60)
Glucose, Bld: 297 mg/dL — ABNORMAL HIGH (ref 65–99)
Potassium: 5.4 mmol/L — ABNORMAL HIGH (ref 3.5–5.1)
Sodium: 135 mmol/L (ref 135–145)
TOTAL PROTEIN: 6.5 g/dL (ref 6.5–8.1)
Total Bilirubin: 0.5 mg/dL (ref 0.3–1.2)

## 2016-01-01 LAB — MAGNESIUM: MAGNESIUM: 2 mg/dL (ref 1.7–2.4)

## 2016-01-01 LAB — URINE MICROSCOPIC-ADD ON

## 2016-01-01 LAB — GLUCOSE, CAPILLARY
GLUCOSE-CAPILLARY: 323 mg/dL — AB (ref 65–99)
GLUCOSE-CAPILLARY: 287 mg/dL — AB (ref 65–99)
Glucose-Capillary: 327 mg/dL — ABNORMAL HIGH (ref 65–99)

## 2016-01-01 LAB — PREPARE RBC (CROSSMATCH)

## 2016-01-01 LAB — PROTIME-INR
INR: 1.37
PROTHROMBIN TIME: 17 s — AB (ref 11.4–15.0)

## 2016-01-01 LAB — ABO/RH
ABO/RH(D): O POS
ABO/RH(D): O POS

## 2016-01-01 LAB — APTT: aPTT: 29 seconds (ref 24–36)

## 2016-01-01 LAB — TROPONIN I: Troponin I: 0.05 ng/mL — ABNORMAL HIGH (ref <0.031)

## 2016-01-01 LAB — FIBRIN DEGRADATION PROD.(ARMC ONLY): Fibrin Degradation Prod.: <10 (ref <10)

## 2016-01-01 LAB — SAVE SMEAR

## 2016-01-01 LAB — PHOSPHORUS: Phosphorus: 2.4 mg/dL — ABNORMAL LOW (ref 2.5–4.6)

## 2016-01-01 LAB — MRSA PCR SCREENING: MRSA BY PCR: NEGATIVE

## 2016-01-01 LAB — LACTATE DEHYDROGENASE: LDH: 152 U/L (ref 98–192)

## 2016-01-01 LAB — TSH: TSH: 1.66 u[IU]/mL (ref 0.350–4.500)

## 2016-01-01 LAB — LIPASE, BLOOD: Lipase: <10 U/L — ABNORMAL LOW (ref 11–51)

## 2016-01-01 MED ORDER — SODIUM CHLORIDE 0.9% FLUSH
10.0000 mL | Freq: Two times a day (BID) | INTRAVENOUS | Status: DC
  Administered 2016-01-01 – 2016-01-03 (×3): 10 mL

## 2016-01-01 MED ORDER — SODIUM CHLORIDE 0.9 % IV SOLN
250.0000 mL | INTRAVENOUS | Status: DC | PRN
  Administered 2016-01-01: 250 mL via INTRAVENOUS

## 2016-01-01 MED ORDER — SODIUM CHLORIDE 0.9 % IV SOLN
8.0000 mg/h | INTRAVENOUS | Status: DC
  Administered 2016-01-01 – 2016-01-02 (×2): 8 mg/h via INTRAVENOUS
  Filled 2016-01-01 (×5): qty 80

## 2016-01-01 MED ORDER — OCTREOTIDE LOAD VIA INFUSION
25.0000 ug | Freq: Once | INTRAVENOUS | Status: AC
  Administered 2016-01-01: 25 ug via INTRAVENOUS
  Filled 2016-01-01: qty 13

## 2016-01-01 MED ORDER — DEXAMETHASONE SODIUM PHOSPHATE 10 MG/ML IJ SOLN
40.0000 mg | INTRAMUSCULAR | Status: DC
Start: 2016-01-01 — End: 2016-01-04
  Administered 2016-01-01 – 2016-01-03 (×3): 40 mg via INTRAVENOUS
  Filled 2016-01-01 (×3): qty 4

## 2016-01-01 MED ORDER — ANTISEPTIC ORAL RINSE SOLUTION (CORINZ)
7.0000 mL | Freq: Four times a day (QID) | OROMUCOSAL | Status: DC
  Administered 2016-01-02 (×3): 7 mL via OROMUCOSAL

## 2016-01-01 MED ORDER — INSULIN ASPART 100 UNIT/ML ~~LOC~~ SOLN
0.0000 [IU] | SUBCUTANEOUS | Status: DC
  Administered 2016-01-01: 8 [IU] via SUBCUTANEOUS
  Administered 2016-01-01: 11 [IU] via SUBCUTANEOUS
  Administered 2016-01-02: 8 [IU] via SUBCUTANEOUS
  Administered 2016-01-02: 11 [IU] via SUBCUTANEOUS
  Administered 2016-01-02: 8 [IU] via SUBCUTANEOUS

## 2016-01-01 MED ORDER — DEXTROSE 50 % IV SOLN
1.0000 | Freq: Once | INTRAVENOUS | Status: DC
  Filled 2016-01-01: qty 50

## 2016-01-01 MED ORDER — SODIUM CHLORIDE 0.9 % IV BOLUS (SEPSIS)
1000.0000 mL | Freq: Once | INTRAVENOUS | Status: AC
  Administered 2016-01-01: 1000 mL via INTRAVENOUS

## 2016-01-01 MED ORDER — SODIUM CHLORIDE 0.9 % IV SOLN: Freq: Once | INTRAVENOUS | Status: AC

## 2016-01-01 MED ORDER — SODIUM CHLORIDE 0.9 % IV SOLN
10.0000 mL/h | Freq: Once | INTRAVENOUS | Status: AC
  Administered 2016-01-01: 10 mL/h via INTRAVENOUS

## 2016-01-01 MED ORDER — CALCIUM GLUCONATE 10 % IV SOLN
1.0000 g | Freq: Once | INTRAVENOUS | Status: AC
  Administered 2016-01-01: 1 g via INTRAVENOUS
  Filled 2016-01-01: qty 10

## 2016-01-01 MED ORDER — LABETALOL HCL 5 MG/ML IV SOLN
INTRAVENOUS | Status: AC
  Filled 2016-01-01: qty 4

## 2016-01-01 MED ORDER — CHLORHEXIDINE GLUCONATE 0.12% ORAL RINSE (MEDLINE KIT)
15.0000 mL | Freq: Two times a day (BID) | OROMUCOSAL | Status: DC
  Administered 2016-01-02 (×2): 15 mL via OROMUCOSAL

## 2016-01-01 MED ORDER — LEVOTHYROXINE SODIUM 25 MCG PO TABS
25.0000 ug | ORAL_TABLET | Freq: Every day | ORAL | Status: DC
  Administered 2016-01-03: 25 ug via ORAL
  Filled 2016-01-01 (×3): qty 1

## 2016-01-01 MED ORDER — SODIUM CHLORIDE 0.9 % IV SOLN: 10.0000 mL/h | Freq: Once | INTRAVENOUS | Status: DC

## 2016-01-01 MED ORDER — ONDANSETRON HCL 4 MG/2ML IJ SOLN: 4.0000 mg | Freq: Four times a day (QID) | INTRAMUSCULAR | Status: DC | PRN

## 2016-01-01 MED ORDER — HYDRALAZINE HCL 20 MG/ML IJ SOLN
INTRAMUSCULAR | Status: AC
  Filled 2016-01-01: qty 1

## 2016-01-01 MED ORDER — SODIUM CHLORIDE 0.9 % IV SOLN
50.0000 ug/h | INTRAVENOUS | Status: DC
  Administered 2016-01-01 – 2016-01-02 (×2): 50 ug/h via INTRAVENOUS
  Filled 2016-01-01 (×5): qty 1

## 2016-01-01 MED ORDER — AMLODIPINE BESYLATE 2.5 MG PO TABS
2.5000 mg | ORAL_TABLET | Freq: Every day | ORAL | Status: DC
  Administered 2016-01-01 – 2016-01-03 (×2): 2.5 mg via ORAL
  Filled 2016-01-01 (×8): qty 1

## 2016-01-01 MED ORDER — SODIUM CHLORIDE 0.9 % IV SOLN
8.0000 mg/h | INTRAVENOUS | Status: DC
  Administered 2016-01-01: 8 mg/h via INTRAVENOUS
  Filled 2016-01-01: qty 80

## 2016-01-01 MED ORDER — IMMUNE GLOBULIN (HUMAN) 20 GM/200ML IV SOLN
75.0000 g | INTRAVENOUS | Status: AC
  Administered 2016-01-01 – 2016-01-02 (×2): 75 g via INTRAVENOUS
  Filled 2016-01-01 (×2): qty 750

## 2016-01-01 MED ORDER — SODIUM CHLORIDE 0.9 % IV SOLN
80.0000 mg | Freq: Once | INTRAVENOUS | Status: DC
  Filled 2016-01-01: qty 80

## 2016-01-01 MED ORDER — ALBUTEROL SULFATE (2.5 MG/3ML) 0.083% IN NEBU
2.5000 mg | INHALATION_SOLUTION | RESPIRATORY_TRACT | Status: DC | PRN
  Administered 2016-01-03: 2.5 mg via RESPIRATORY_TRACT
  Filled 2016-01-01: qty 3

## 2016-01-01 MED ORDER — DEXAMETHASONE SODIUM PHOSPHATE 4 MG/ML IJ SOLN
40.0000 mg | INTRAMUSCULAR | Status: DC
  Filled 2016-01-01: qty 10

## 2016-01-01 MED ORDER — SODIUM CHLORIDE 0.9 % IV SOLN
50.0000 ug/h | INTRAVENOUS | Status: DC
  Administered 2016-01-01: 50 ug/h via INTRAVENOUS
  Filled 2016-01-01 (×2): qty 1

## 2016-01-01 MED ORDER — SODIUM CHLORIDE 0.9% FLUSH: 10.0000 mL | INTRAVENOUS | Status: DC | PRN

## 2016-01-01 MED ORDER — INSULIN ASPART 100 UNIT/ML ~~LOC~~ SOLN
10.0000 [IU] | Freq: Once | SUBCUTANEOUS | Status: AC
  Administered 2016-01-01: 10 [IU] via INTRAVENOUS

## 2016-01-01 MED ORDER — SODIUM CHLORIDE 0.9 % IV SOLN
INTRAVENOUS | Status: DC
  Administered 2016-01-01 – 2016-01-02 (×4): via INTRAVENOUS

## 2016-01-01 MED ORDER — LABETALOL HCL 5 MG/ML IV SOLN: 10.0000 mg | Freq: Once | INTRAVENOUS | Status: DC

## 2016-01-01 MED ORDER — DEXTROSE 50 % IV SOLN
INTRAVENOUS | Status: AC
  Filled 2016-01-01: qty 50

## 2016-01-01 MED ORDER — HYDRALAZINE HCL 20 MG/ML IJ SOLN
10.0000 mg | INTRAMUSCULAR | Status: DC | PRN
  Administered 2016-01-01: 20 mg via INTRAVENOUS

## 2016-01-01 MED ORDER — CLONIDINE HCL 0.1 MG PO TABS
0.1000 mg | ORAL_TABLET | Freq: Every day | ORAL | Status: DC
  Administered 2016-01-01 – 2016-01-02 (×2): 0.1 mg via ORAL
  Filled 2016-01-01 (×3): qty 1

## 2016-01-01 NOTE — Consult Note (Signed)
[facility] [place] [facility]   PATIENT NAME: Frank Gentry    MR#:  [id number]  DATE OF BIRTH:  [birth year]  DATE OF CONSULT:  01/15/2016  PRIMARY CARE PHYSICIAN: Lauro Regulus., MD   REQUESTING/REFERRING PHYSICIAN: Dr. Minna Antis  CHIEF COMPLAINT:   Chief Complaint  Patient presents with  . Hematemesis    HISTORY OF PRESENT ILLNESS:  Frank Gentry  is a 75 y.o. male with a known history of hypertension, hyperlipidemia, type 2 diabetes, history of previous CVA, COPD, BPH, depression/anxiety, hypothyroidism presented to the hospital due to an episode of hematemesis and having a large black bowel movement in the ER. Patient was also noted to have a platelet count of less than 5000 and severely thrombocytopenic. Initially patient was going to be admitted for GI bleed but  Given the severe thrombocytopenia and possibility for underlying worsening hematologic condition patient is now being transferred to [facility] for further evaluation. Patient himself denies any abdominal pain, fever, chills, chest pain, shortness of breath or any other associated symptoms. He says he woke up this morning and had some nausea and had some hematemesis and therefore was sent from the nursing home to the ER for further evaluation. In the emergency room patient had a large black bowel movement she was heme positive. He was noted to be anemic and thrombocytopenic.  PAST MEDICAL HISTORY:   Past Medical History  Diagnosis Date  . Essential hypertension   . Hyperlipidemia   . 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 History of CVA (cerebrovascular accident)   . COPD (chronic obstructive pulmonary disease) (HCC)   . BPH (benign prostatic hyperplasia)   . Depression   . Anxiety   . Hypothyroidism     PAST SURGICAL HISTOIRY:  No past surgical history on file.  SOCIAL HISTORY:   Social History  Substance Use Topics  . Smoking status: Former Smoker  -- 1.00 packs/day for 30 years    Types: Cigarettes  . Smokeless tobacco: Not on file  . Alcohol Use: No    FAMILY HISTORY:   Family History  Problem Relation Age of Onset  . CVA Brother     DRUG ALLERGIES:  No Known Allergies  REVIEW OF SYSTEMS:   Review of Systems  Constitutional: Negative for fever and weight loss.  HENT: Negative for congestion, nosebleeds and tinnitus.   Eyes: Negative for blurred vision, double vision and redness.  Respiratory: Negative for cough, hemoptysis and shortness of breath.   Cardiovascular: Negative for chest pain, orthopnea, leg swelling and PND.  Gastrointestinal: Positive for nausea, vomiting and blood in stool. Negative for abdominal pain, diarrhea and melena.  Genitourinary: Negative for dysuria, urgency and hematuria.  Musculoskeletal: Negative for joint pain and falls.  Neurological: Negative for dizziness, tingling, sensory change, focal weakness, seizures, weakness and headaches.  Endo/Heme/Allergies: Negative for polydipsia. Does not bruise/bleed easily.  Psychiatric/Behavioral: Negative for depression and memory loss. The patient is not nervous/anxious.      MEDICATIONS AT HOME:   Prior to Admission medications   Medication Sig Start Date End Date Taking? Authorizing Provider  acetaminophen (TYLENOL) 325 MG tablet Take 650 mg by mouth every 4 (four) hours as needed.   Yes Historical Provider, MD  acetaminophen (TYLENOL) 500 MG tablet Take 500 mg by mouth daily.   Yes Historical Provider, MD  amLODipine (NORVASC) 2.5 MG tablet Take 1 tablet by mouth daily. 12/08/15  Yes Historical Provider, MD  camphor-menthol (SARNA) lotion Apply 1 application topically 2 (two) times daily.   Yes Historical Provider, MD  cetirizine (ZYRTEC) 10 MG tablet Take 10 mg by mouth at bedtime.   Yes Historical Provider, MD  cloNIDine (CATAPRES) 0.2 MG tablet Take 1 tablet by mouth at bedtime.  12/08/15  Yes Historical Provider, MD  clopidogrel (PLAVIX) 75 MG  tablet Take 1 tablet by mouth daily. 12/29/15  Yes Historical Provider, MD  clotrimazole-betamethasone (LOTRISONE) cream Apply 1 application topically 2 (two) times daily.  12/30/15  Yes Historical Provider, MD  coal tar (NEUTROGENA T-GEL) 0.5 % shampoo Apply 1 application topically 2 (two) times a week.   Yes Historical Provider, MD  COMBIVENT RESPIMAT 20-100 MCG/ACT AERS respimat Inhale 1 puff into the lungs 3 (three) times daily as needed.  12/24/15  Yes Historical Provider, MD  doxepin (SINEQUAN) 10 MG capsule Take 1 capsule by mouth every 12 (twelve) hours as needed.  12/19/15 01/03/16 Yes Historical Provider, MD  fenofibrate (TRICOR) 48 MG tablet Take 2 tablets by mouth daily.  12/01/15  Yes Historical Provider, MD  finasteride (PROSCAR) 5 MG tablet Take 1 tablet by mouth daily. 12/29/15  Yes Historical Provider, MD  fluticasone (FLONASE) 50 MCG/ACT nasal spray Place 2 sprays into both nostrils daily as needed for allergies or rhinitis.   Yes Historical Provider, MD  guaifenesin (ROBITUSSIN) 100 MG/5ML syrup Take 200 mg by mouth every 4 (four) hours as needed for cough.   Yes Historical Provider, MD  HUMALOG MIX 75/25 (75-25) 100 UNIT/ML SUSP injection Inject 55-100 Units into the skin 2 (two) times daily. Pt. Takes 100 units at 0800 and 55 units at 1700 12/25/15  Yes Historical Provider, MD  Hydrocortisone (GERHARDT'S BUTT CREAM) CREA Apply 1 application topically as needed for irritation.   Yes Historical Provider, MD  hydrOXYzine (ATARAX/VISTARIL) 10 MG tablet Take 10 mg by mouth every 6 (six) hours as needed.   Yes Historical Provider, MD  levothyroxine (SYNTHROID, LEVOTHROID) 25 MCG tablet Take 1 tablet by mouth daily. 12/08/15  Yes Historical Provider, MD  lisinopril (PRINIVIL,ZESTRIL) 20 MG tablet Take 20 mg by mouth 2 (two) times daily.  12/15/15  Yes Historical Provider, MD  LYRICA 25 MG capsule Take 25 mg by mouth every 12 (twelve) hours.  12/15/15  Yes Historical Provider, MD  polyvinyl  alcohol-povidone (HYPOTEARS) 1.4-0.6 % ophthalmic solution Place 2 drops into both eyes 4 (four) times daily as needed.   Yes Historical Provider, MD  potassium chloride (K-DUR,KLOR-CON) 10 MEQ tablet Take 10 mEq by mouth daily.   Yes Historical Provider, MD  pravastatin (PRAVACHOL) 20 MG tablet Take 1 tablet by mouth at bedtime.  12/22/15  Yes Historical Provider, MD  senna-docusate (SENOKOT-S) 8.6-50 MG tablet Take 1 tablet by mouth 2 (two) times daily.   Yes Historical Provider, MD  Skin Protectants, Misc. (EUCERIN) cream Apply 1 application topically 2 (two) times daily as needed for dry skin.   Yes Historical Provider, MD  SUNSCREENS EX Apply 1 application topically daily. Spf 70   Yes Historical Provider, MD  tamsulosin (FLOMAX) 0.4 MG CAPS capsule Take 1 capsule by mouth daily. 12/01/15  Yes Historical Provider, MD  torsemide (DEMADEX) 10 MG tablet Take 15 mg by mouth daily.  12/17/15  Yes Historical Provider, MD  traZODone (DESYREL) 50 MG tablet Take 1 tablet by mouth at bedtime. 12/24/15  Yes Historical Provider, MD  triamcinolone ointment (KENALOG) 0.1 % Apply 1 application topically 2 (two) times daily as needed.   Yes  Historical Provider, MD  venlafaxine XR (EFFEXOR-XR) 75 MG 24 hr capsule Take 1 capsule by mouth daily. 12/25/15  Yes Historical Provider, MD  vitamin B-12 (CYANOCOBALAMIN) 1000 MCG tablet Take 1,000 mcg by mouth daily.   Yes Historical Provider, MD  citalopram (CELEXA) 20 MG tablet Take 1 tablet by mouth daily. 12/24/15   Historical Provider, MD      VITAL SIGNS:  Blood pressure 193/71, pulse 102, temperature 97.8 F (36.6 C), temperature source Oral, resp. rate 19, height 5\' 9"  (1.753 m), weight 74.844 kg (165 lb), SpO2 100 %.  PHYSICAL EXAMINATION:  GENERAL:  75 y.o.-year-old pale appearing patient lying in the bed with no acute distress.  EYES: Pupils equal, round, reactive to light and accommodation. No scleral icterus. Extraocular muscles intact. Pale  conjunctiva HEENT: Head atraumatic, normocephalic. Oropharynx and nasopharynx clear.  NECK:  Supple, no jugular venous distention. No thyroid enlargement, no tenderness.  LUNGS: Normal breath sounds bilaterally, no wheezing, rales, rhonchi . No use of accessory muscles of respiration.  CARDIOVASCULAR: S1, S2, RRR. No murmurs, rubs, gallops, clicks.  ABDOMEN: Soft, nontender, nondistended. Bowel sounds present. No organomegaly or mass.  EXTREMITIES: +1 edema on the left as compared to the right., cyanosis, or clubbing.  NEUROLOGIC: Cranial nerves II through XII are intact. Dense left-sided hemiparesis. PSYCHIATRIC: The patient is alert and oriented x 3.  SKIN: No obvious rash, lesion, or ulcer. Many petechia throughout body   LABORATORY PANEL:   CBC  Recent Labs Lab 2016/01/03 0822  WBC 9.6  HGB 7.0*  HCT 21.7*  PLT <5*   ------------------------------------------------------------------------------------------------------------------  Chemistries   Recent Labs Lab 2016/01/03 0822  NA 135  K 5.4*  CL 98*  CO2 26  GLUCOSE 297*  BUN 62*  CREATININE 2.17*  CALCIUM 8.7*  AST 29  ALT 22  ALKPHOS 45  BILITOT 0.5   ------------------------------------------------------------------------------------------------------------------  Cardiac Enzymes No results for input(s): TROPONINI in the last 168 hours. ------------------------------------------------------------------------------------------------------------------  RADIOLOGY:  No results found.   IMPRESSION AND PLAN:   75 year old male with past medical history of diabetes, hypertension, hyperlipidemia, history of previous CVA, diabetic neuropathy, who presented to the hospital due to hematemesis and dark stools and noted to have a GI bleed and also noted to be significantly thrombocytopenic.  #1 GI bleed-this is an upper GI bleed. Patient presented with hematemesis and also black stools. -Continue supportive care with  blood transfusions, IV fluids, octreotide drip, Protonix drip. -Follow hemodynamics. Hold Plavix, aspirin. Patient has a significantly low platelet count and will benefit from platelet transfusions too.    #2 acute blood loss anemia-this is secondary to the GI bleed as mentioned above. -Continue blood transfusions, IV fluids, follow hemoglobin. Hold Plavix, aspirin for now.  #3 thrombocytopenia-this is severe thrombocytopenia with high risk for spontaneous bleeding. Patient's platelet count is less than 5000 and his previous platelet Count in May 2016 was normal. -Discussed plan of care with Dr. Merlene Pulling from hematology. There are no schistocytes noted in the peripheral smear. I have ordered LDH, fibrinogen degradation products. Patient's PT is mildly elevated but the PTT INR normal. -Patient does have some mild renal failure, and also a anemia. She initially was going to be admitted for workup for GI bleed here but after speaking to oncology they think patient would better off at a tertiary care center. Patient is being transferred to [facility] for further evaluation. This was discussed with the ER physician Dr. Lenard Lance who is in agreement.  #4 acute renal failure-due to  acute blood loss. -Continue IV fluids, blood transfusion. Follow BUN, creatinine. Renal dose meds avoid nephrotoxins.  Patient is being transferred to [facility] for further treatment and evaluation.   All the records are reviewed and case discussed with Consulting provider. Management plans discussed with the patient, family and they are in agreement.  CODE STATUS: Full  TOTAL TIME TAKING CARE OF THIS PATIENT: 45 minutes.    Houston Siren M.D on 2016/01/08 at 12:11 PM  Between 7am to 6pm - Pager - [phone]  After 6pm go to [email or web address] - password [email or web address] [facility]  [facility]  Office  [phone]  CC: Primary care Physician: Lauro Regulus., MD

## 2016-01-01 NOTE — ED Notes (Signed)
Patient cleaned and placed onto bedpan.

## 2016-01-01 NOTE — Progress Notes (Signed)
Peripherally Inserted Central Catheter/Midline Placement  The IV Nurse has discussed with the patient and/or persons authorized to consent for the patient, the purpose of this procedure and the potential benefits and risks involved with this procedure.  The benefits include less needle sticks, lab draws from the catheter and patient may be discharged home with the catheter.  Risks include, but not limited to, infection, bleeding, blood clot (thrombus formation), and puncture of an artery; nerve damage and irregular heat beat.  Alternatives to this procedure were also discussed.  PICC/Midline Placement Documentation  PICC Triple Lumen 2016-01-28 PICC Right Basilic 45 cm 1 cm (Active)  Indication for Insertion or Continuance of Line Limited venous access - need for IV therapy >5 days (PICC only) January 28, 2016  7:55 PM  Exposed Catheter (cm) 1 cm 01/28/16  7:55 PM  Site Assessment Clean;Dry;Intact 2016-01-28  7:55 PM  Lumen #1 Status Blood return noted;Flushed;Saline locked 01/28/16  7:55 PM  Lumen #2 Status Blood return noted;Flushed;Saline locked January 28, 2016  7:55 PM  Lumen #3 Status Blood return noted;Flushed;Saline locked 28-Jan-2016  7:55 PM  Dressing Type Transparent 01-28-16  7:55 PM  Dressing Status Clean;Dry;Intact;Antimicrobial disc in place 28-Jan-2016  7:55 PM  Dressing Intervention New dressing 2016-01-28  7:55 PM  Dressing Change Due 01/08/16 2016/01/28  7:55 PM       [name] 01/28/2016, 8:16 PM

## 2016-01-01 NOTE — H&P (Signed)
PULMONARY / CRITICAL CARE MEDICINE   Name: Frank Gentry MRN: [id number] DOB: [birth year]    ADMISSION DATE:  01/25/2016 CONSULTATION DATE:  01/11/2016  REFERRING MD:  Dr. 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  He reports he was in his usual state of health on 12/31/15.  Normal eating / drinking habits.  He woke on the am of 3/2 with abdominal discomfort and bright red vomiting.  He reports approximately 3-4 episodes of vomiting.  He was evaluated in the ER at [facility] and later noted to have melena (guiac positive).  Initial labs - WBC 9.6, Hgb 7 (03/2015 12.8), MCV 72, Platelets <5, Na 135, K 5.4, CL 98, BUN 62 / Sr Cr 2.17, glucose 297, & LDH 152.  He was hypertensive requiring treatment with labetalol. Initial CXR was negative for acute infiltrates.  He was treated with protonix / octreotide gtts.    The transferring hospital did not have platelets and was transferred to [facility] for further evaluation.     PAST MEDICAL HISTORY :  He  has a past medical history of Essential hypertension; Hyperlipidemia; Type II diabetes with long term use of insulin (HCC); History of CVA (cerebrovascular accident); COPD (chronic obstructive pulmonary disease) (HCC); BPH (benign prostatic hyperplasia); Depression; Anxiety; and Hypothyroidism.  PAST SURGICAL HISTORY: He  has no past surgical history on file.  No Known Allergies  No current facility-administered medications on file prior to encounter.   Current Outpatient Prescriptions on File Prior to Encounter  Medication Sig  . acetaminophen (TYLENOL) 325 MG tablet Take 650 mg by mouth every 4 (four) hours as needed.  Marland Kitchen acetaminophen (TYLENOL) 500 MG tablet  Take 500 mg by mouth daily.  Marland Kitchen amLODipine (NORVASC) 2.5 MG tablet Take 1 tablet by mouth daily.  . camphor-menthol (SARNA) lotion Apply 1 application topically 2 (two) times daily.  . cetirizine (ZYRTEC) 10 MG tablet Take 10 mg by mouth at bedtime.  . citalopram (CELEXA) 20 MG tablet Take 1 tablet by mouth daily.  . cloNIDine (CATAPRES) 0.2 MG tablet Take 1 tablet by mouth at bedtime.   . clopidogrel (PLAVIX) 75 MG tablet Take 1 tablet by mouth daily.  . clotrimazole-betamethasone (LOTRISONE) cream Apply 1 application topically 2 (two) times daily.   . coal tar (NEUTROGENA T-GEL) 0.5 % shampoo Apply 1 application topically 2 (two) times a week.  . COMBIVENT RESPIMAT 20-100 MCG/ACT AERS respimat Inhale 1 puff into the lungs 3 (three) times daily as needed.   . doxepin (SINEQUAN) 10 MG capsule Take 1 capsule by mouth every 12 (twelve) hours as needed.   . fenofibrate (TRICOR) 48 MG tablet Take 2 tablets by mouth daily.   . finasteride (PROSCAR) 5 MG tablet Take 1 tablet by mouth daily.  . fluticasone (FLONASE) 50 MCG/ACT nasal spray Place 2 sprays into both nostrils daily as needed for allergies or rhinitis.  Marland Kitchen guaifenesin (ROBITUSSIN) 100 MG/5ML syrup Take 200 mg by mouth every 4 (four) hours as needed for cough.  Marland Kitchen HUMALOG MIX 75/25 (75-25) 100 UNIT/ML SUSP injection Inject 55-100 Units into the skin 2 (two) times daily. Pt. Takes 100 units at 0800 and 55 units at 1700  . Hydrocortisone (  GERHARDT'S BUTT CREAM) CREA Apply 1 application topically as needed for irritation.  . hydrOXYzine (ATARAX/VISTARIL) 10 MG tablet Take 10 mg by mouth every 6 (six) hours as needed.  Marland Kitchen levothyroxine (SYNTHROID, LEVOTHROID) 25 MCG tablet Take 1 tablet by mouth daily.  Marland Kitchen lisinopril (PRINIVIL,ZESTRIL) 20 MG tablet Take 20 mg by mouth 2 (two) times daily.   Marland Kitchen LYRICA 25 MG capsule Take 25 mg by mouth every 12 (twelve) hours.   . polyvinyl alcohol-povidone (HYPOTEARS) 1.4-0.6 % ophthalmic solution Place 2 drops into  both eyes 4 (four) times daily as needed.  . potassium chloride (K-DUR,KLOR-CON) 10 MEQ tablet Take 10 mEq by mouth daily.  . pravastatin (PRAVACHOL) 20 MG tablet Take 1 tablet by mouth at bedtime.   . senna-docusate (SENOKOT-S) 8.6-50 MG tablet Take 1 tablet by mouth 2 (two) times daily.  . Skin Protectants, Misc. (EUCERIN) cream Apply 1 application topically 2 (two) times daily as needed for dry skin.  . SUNSCREENS EX Apply 1 application topically daily. Spf 70  . tamsulosin (FLOMAX) 0.4 MG CAPS capsule Take 1 capsule by mouth daily.  Marland Kitchen torsemide (DEMADEX) 10 MG tablet Take 15 mg by mouth daily.   . traZODone (DESYREL) 50 MG tablet Take 1 tablet by mouth at bedtime.  . triamcinolone ointment (KENALOG) 0.1 % Apply 1 application topically 2 (two) times daily as needed.  . venlafaxine XR (EFFEXOR-XR) 75 MG 24 hr capsule Take 1 capsule by mouth daily.  . vitamin B-12 (CYANOCOBALAMIN) 1000 MCG tablet Take 1,000 mcg by mouth daily.    FAMILY HISTORY:  His indicated that his mother is deceased. He indicated that his father is deceased. He indicated that his brother is deceased.   SOCIAL HISTORY: He  reports that he has quit smoking. His smoking use included Cigarettes. He has a 30 pack-year smoking history. He does not have any smokeless tobacco history on file. He reports that he does not drink alcohol or use illicit drugs.  REVIEW OF SYSTEMS:   Gen: Denies fever, chills, weight change, fatigue, night sweats HEENT: Denies blurred vision, double vision, hearing loss, tinnitus, sinus congestion, rhinorrhea, sore throat, neck stiffness, dysphagia PULM: Denies shortness of breath, cough, sputum production, hemoptysis, wheezing CV: Denies chest pain, edema, orthopnea, paroxysmal nocturnal dyspnea, palpitations GI: Denies diarrhea, hematochezia, constipation, change in bowel habits.  Reports abdominal discomfort, hematemesis, melena. GU: Denies dysuria, hematuria, polyuria, oliguria, urethral  discharge Endocrine: Denies hot or cold intolerance, polyuria, polyphagia or appetite change Derm: Denies rash, dry skin, scaling or peeling skin change Heme: Denies easy bruising, bleeding, bleeding gums Neuro: Denies headache, numbness, weakness, slurred speech, loss of memory or consciousness   SUBJECTIVE:  Pt currently denies pain, headache, chest pain, vision changes  VITAL SIGNS: There were no vitals taken for this visit.  HEMODYNAMICS:    VENTILATOR SETTINGS:    INTAKE / OUTPUT:    PHYSICAL EXAMINATION: General:  Elderly male in NAD  Neuro:  Awake, alert, moves right side spontaneously, Left hemiparesis  HEENT:  MM pink/moist, no jvd Cardiovascular:  s1s2 irregular, SR with PAC's on monitor Lungs:  Even/non-labored, faint crackles RLL  Abdomen:  Obese/soft, bsx4 active  Musculoskeletal:  No acute deformities  Skin:  Pale, warm, dry, scattered petechiae noted   LABS:  BMET  Recent Labs Lab 01/18/2016 0822  NA 135  K 5.4*  CL 98*  CO2 26  BUN 62*  CREATININE 2.17*  GLUCOSE 297*    Electrolytes  Recent Labs Lab 01/15/2016 0822  CALCIUM 8.7*  CBC  Recent Labs Lab 01/16/2016 0822  WBC 9.6  HGB 7.0*  HCT 21.7*  PLT <5*    Coag's  Recent Labs Lab 01/15/2016 0822  APTT 29  INR 1.37    Sepsis Markers No results for input(s): LATICACIDVEN, PROCALCITON, O2SATVEN in the last 168 hours.  ABG No results for input(s): PHART, PCO2ART, PO2ART in the last 168 hours.  Liver Enzymes  Recent Labs Lab 12/31/2015 0822  AST 29  ALT 22  ALKPHOS 45  BILITOT 0.5  ALBUMIN 3.3*    Cardiac Enzymes No results for input(s): TROPONINI, PROBNP in the last 168 hours.  Glucose  Recent Labs Lab 12/26/15 0609 12/27/15 0645 12/28/15 0618 12/29/15 0614 12/30/15 0618 12/31/15 0601  GLUCAP 208* 215* 90 317* 185* 196*    Imaging Dg Chest Portable 1 View  01/12/2016  CLINICAL DATA:  Hematemesis and hematochezia EXAM: PORTABLE CHEST 1 VIEW COMPARISON:   10/31/2009 FINDINGS: Cardiac shadow is within normal limits. The lungs are well aerated bilaterally. No focal infiltrate or sizable effusion is seen. No acute bony abnormality is noted. Calcified granuloma is again noted in the left apex. IMPRESSION: No active disease. Electronically Signed   By: Alcide Clever M.D.   On: 01/22/2016 14:43     STUDIES:  EKG 3/2 >>    CULTURES: BCx2 3/2 >>  UA 3/2 >>   ANTIBIOTICS:   SIGNIFICANT EVENTS: 3/02  Admit from [facility] with GIB   LINES/TUBES:   DISCUSSION: 75 y/o M admitted with acute onset GIB, ABL and profound thrombocytopenia with concerns for ITP.    ASSESSMENT / PLAN:  PULMONARY A: Hx COPD, Former Tobacco Abuse  P:   Oxygen as needed for saturations >92% PRN albuterol for wheezing   CARDIOVASCULAR A:  Hx HTN, HLD PAC's P:  ICU monitoring  Assess EKG, troponin  Continue home norvasc, clonidine (reduced dose) Hold home lisinopril, demadex  RENAL A:   Acute Kidney Injury - in setting of ABL  P:   Trend BMP / UOP  Replace electrolytes as indicated  NS @ 50 ml/hr  GASTROINTESTINAL A:   GIB - acute hematemes P:   NPO  GI Consult ([facility] called) Protonix gtt Octreotide gtt for now (no hx of ETOH) until seen by GI  HEMATOLOGIC A:   Anemia  Thrombocytopenia - r/o ITP P:  Assess peripheral smear  Trend CBC  1 pack platelets now  LDH in am  Assess Haptoglobin  Hematology consult   INFECTIOUS A:  Rule Out Infectious Etiology of Thrombocytopenia  P:   Assess cultures as above Hold abx for now   ENDOCRINE A:   DM II Hypothyroidism  P:   Monitor CBG / glucose on BMP  Q4 CBG SSI  Assess TSH  Continue snythroid   NEUROLOGIC A:   Hx CVA - on plavix at baseline  Depression / Anxiety  P:   RASS goal: 0 Monitor for neuro changes with thrombocytopenia  Hold home plavix  Hold home effexor, trazodone, lyrica, clexa    FAMILY  - Updates: No family available at bedside. Patient updated on plan of  care.   - Inter-disciplinary family meet or Palliative Care meeting due by:  01/08/16   Canary Brim, NP-C [facility] Pgr: [phone] or if no answer [phone] 01/03/2016, 4:28 PM  Attending note:  75 year old male presenting with GI bleeding. In [facility], CBC came back and it was noted to have a platelet count of <5. EDP called [facility]  to transfer to diagnosis of ITP. On exam, lungs are clear and left sided hemiparesis. I reviewed CXR myself, no acute disease. 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 Discussed with Dr. Truett Perna from H/O service will evaluate patient. Discussed with GI, will see patient, transfuse platelet given active GI bleeding. Hold in the ICU for observation. Minimize blood sticks as able. H/O will see and decide on steroids vs IVIg.  The patient is critically ill with multiple organ systems failure and requires high complexity decision making for assessment and support, frequent evaluation and titration of therapies, application of advanced monitoring technologies and extensive interpretation of multiple databases.   Critical Care Time devoted to patient care services described in this note is 35 Minutes. This time reflects time of care of this signee Dr Koren Bound. This critical care time does not reflect procedure time, or teaching time or supervisory time of PA/NP/Med student/Med Resident etc but could involve care discussion time.  Alyson Reedy, M.D. [facility] Pulmonary/Critical Care Medicine. Pager: [phone]. After hours pager: [phone].

## 2016-01-01 NOTE — ED Notes (Signed)
Pt resting quietly with his eyes closed  Family remains at bedside    blood continues to infuse   Hematology consult pending  Pt will hold in ED until consult completed

## 2016-01-01 NOTE — ED Notes (Signed)
Pt with active vomiting at this time  Bright red emesis with large blood clots

## 2016-01-01 NOTE — Progress Notes (Signed)
eLink Physician-Brief Progress Note Patient Name: Frank Gentry DOB: [birth year] MRN: [id number]   Date of Service  01/10/2016  HPI/Events of Note  Patient has a DNR form that came with him from [place] in transfer. I discussed case with dr Molli Knock, no other code status wishes expressed by the patient to contradict his existing instructions. Will place formal DNR orders in the chart, continue full medical care.   eICU Interventions       Intervention Category Major Interventions: End of life / care limitation discussion  [name] S. 01/30/2016, 8:26 PM

## 2016-01-01 NOTE — Consult Note (Signed)
[facility] CONSULT Reason for consult: G.I. bleeding with hematemesis and melena Referring Physician: Critical Care Medicine  Frank Gentry is an 75 y.o. male.  HPI: he was transferred over from [facility] to to severe thrombocytopenia. He is a resident of a nursing home following a CVA. He also has significant COPD type II diabetes and hypertension. His son and daughter are in the room with him. He gives a history of having had severe heartburn in the past. According to his daughter prior to the CVA, he used to  "eat Tums and Rolaids nearly constantly". Apparently after the CVA he stopped feeling his dyspepsia and heartburn and quit using Rolaids and Tums. They are not certain if he was taking anti-acid medication at the nursing home. According to the medications listed I do not see any*reducing medications. He was on Plavix. He apparently it had nasal congestions and flulike symptoms week or so ago. He had hematemesis melenic stools and was found to have a platelet count of less than 5000 and hemoglobin of 7 resulting in transfer to [facility]. The patient denies any dyspepsia over heartburn, he denies any abdominal pain. He has no dysphagia for liquids or solids and states that he feeds himself and eats well without any problems. He has never had colonoscopy or EGD. He has no known family history of upper or lower G.I. cancers.                              '  Past Medical History  Diagnosis Date  . Essential hypertension   . Hyperlipidemia   . 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 History of CVA (cerebrovascular accident)   . COPD (chronic obstructive pulmonary disease) (HCC)   . BPH (benign prostatic hyperplasia)   . Depression   . Anxiety   . Hypothyroidism     No past surgical history on file.  Family History  Problem Relation Age of Onset  . CVA Brother     Social History:  reports that he has quit smoking. His  smoking use included Cigarettes. He has a 30 pack-year smoking history. He does not have any smokeless tobacco history on file. He reports that he does not drink alcohol or use illicit drugs.  Allergies: No Known Allergies  Medications; Prior to Admission medications   Medication Sig Start Date End Date Taking? Authorizing Provider  guaifenesin (ROBITUSSIN) 100 MG/5ML syrup Take 200 mg by mouth every 4 (four) hours as needed for cough.   Yes Historical Provider, MD  hydrOXYzine (ATARAX/VISTARIL) 10 MG tablet Take 10 mg by mouth every 6 (six) hours as needed.   Yes Historical Provider, MD  acetaminophen (TYLENOL) 325 MG tablet Take 650 mg by mouth every 4 (four) hours as needed.    Historical Provider, MD  acetaminophen (TYLENOL) 500 MG tablet Take 500 mg by mouth daily.    Historical Provider, MD  amLODipine (NORVASC) 2.5 MG tablet Take 1 tablet by mouth daily. 12/08/15   Historical Provider, MD  camphor-menthol Wynelle Fanny) lotion Apply 1 application topically 2 (two) times daily.    Historical Provider, MD  cetirizine (ZYRTEC) 10 MG tablet Take 10 mg by mouth at bedtime.    Historical Provider, MD  citalopram (CELEXA) 20 MG tablet Take 1 tablet by mouth daily. 12/24/15   Historical Provider, MD  cloNIDine (CATAPRES) 0.2 MG tablet Take 1 tablet by mouth at bedtime.  12/08/15  Historical Provider, MD  clopidogrel (PLAVIX) 75 MG tablet Take 1 tablet by mouth daily. 12/29/15   Historical Provider, MD  clotrimazole-betamethasone (LOTRISONE) cream Apply 1 application topically 2 (two) times daily.  12/30/15   Historical Provider, MD  coal tar (NEUTROGENA T-GEL) 0.5 % shampoo Apply 1 application topically 2 (two) times a week.    Historical Provider, MD  COMBIVENT RESPIMAT 20-100 MCG/ACT AERS respimat Inhale 1 puff into the lungs 3 (three) times daily as needed.  12/24/15   Historical Provider, MD  doxepin (SINEQUAN) 10 MG capsule Take 1 capsule by mouth every 12 (twelve) hours as needed.  12/19/15 01/03/16   Historical Provider, MD  fenofibrate (TRICOR) 48 MG tablet Take 2 tablets by mouth daily.  12/01/15   Historical Provider, MD  finasteride (PROSCAR) 5 MG tablet Take 1 tablet by mouth daily. 12/29/15   Historical Provider, MD  fluticasone (FLONASE) 50 MCG/ACT nasal spray Place 2 sprays into both nostrils daily as needed for allergies or rhinitis.    Historical Provider, MD  HUMALOG MIX 75/25 (75-25) 100 UNIT/ML SUSP injection Inject 55-100 Units into the skin 2 (two) times daily. Pt. Takes 100 units at 0800 and 55 units at 1700 12/25/15   Historical Provider, MD  Hydrocortisone (GERHARDT'S BUTT CREAM) CREA Apply 1 application topically as needed for irritation.    Historical Provider, MD  levothyroxine (SYNTHROID, LEVOTHROID) 25 MCG tablet Take 1 tablet by mouth daily. 12/08/15   Historical Provider, MD  lisinopril (PRINIVIL,ZESTRIL) 20 MG tablet Take 20 mg by mouth 2 (two) times daily.  12/15/15   Historical Provider, MD  LYRICA 25 MG capsule Take 25 mg by mouth every 12 (twelve) hours.  12/15/15   Historical Provider, MD  polyvinyl alcohol-povidone (HYPOTEARS) 1.4-0.6 % ophthalmic solution Place 2 drops into both eyes 4 (four) times daily as needed.    Historical Provider, MD  potassium chloride (K-DUR,KLOR-CON) 10 MEQ tablet Take 10 mEq by mouth daily.    Historical Provider, MD  pravastatin (PRAVACHOL) 20 MG tablet Take 1 tablet by mouth at bedtime.  12/22/15   Historical Provider, MD  senna-docusate (SENOKOT-S) 8.6-50 MG tablet Take 1 tablet by mouth 2 (two) times daily.    Historical Provider, MD  Skin Protectants, Misc. (EUCERIN) cream Apply 1 application topically 2 (two) times daily as needed for dry skin.    Historical Provider, MD  SUNSCREENS EX Apply 1 application topically daily. Spf 70    Historical Provider, MD  tamsulosin (FLOMAX) 0.4 MG CAPS capsule Take 1 capsule by mouth daily. 12/01/15   Historical Provider, MD  torsemide (DEMADEX) 10 MG tablet Take 15 mg by mouth daily.  12/17/15    Historical Provider, MD  traZODone (DESYREL) 50 MG tablet Take 1 tablet by mouth at bedtime. 12/24/15   Historical Provider, MD  triamcinolone ointment (KENALOG) 0.1 % Apply 1 application topically 2 (two) times daily as needed. For rASH    Historical Provider, MD  venlafaxine XR (EFFEXOR-XR) 75 MG 24 hr capsule Take 1 capsule by mouth daily. 12/25/15   Historical Provider, MD  vitamin B-12 (CYANOCOBALAMIN) 1000 MCG tablet Take 1,000 mcg by mouth daily.    Historical Provider, MD   . amLODipine  2.5 mg Oral Daily  . cloNIDine  0.1 mg Oral QHS  . dexamethasone  40 mg Intravenous Q24H  . IMMUNE GLOBULIN 10% (HUMAN) IV - For Fluid Restriction Only  75 g Intravenous Q24H  . insulin aspart  0-15 Units Subcutaneous 6 times per day  . [  START ON 01/02/2016] levothyroxine  25 mcg Oral QAC breakfast   PRN Meds sodium chloride, albuterol, hydrALAZINE, ondansetron (ZOFRAN) IV Results for orders placed or performed during the hospital encounter of 2016/01/23 (from the past 48 hour(s))  Glucose, capillary     Status: Abnormal   Collection Time: Jan 23, 2016  3:36 PM  Result Value Ref Range   Glucose-Capillary 323 (H) 65 - 99 mg/dL  MRSA PCR Screening     Status: None   Collection Time: January 23, 2016  3:45 PM  Result Value Ref Range   MRSA by PCR NEGATIVE NEGATIVE    Comment:        The GeneXpert MRSA Assay (FDA approved for NASAL specimens only), is one component of a comprehensive MRSA colonization surveillance program. It is not intended to diagnose MRSA infection nor to guide or monitor treatment for MRSA infections.     Dg Chest Portable 1 View  January 23, 2016  CLINICAL DATA:  Hematemesis and hematochezia EXAM: PORTABLE CHEST 1 VIEW COMPARISON:  10/31/2009 FINDINGS: Cardiac shadow is within normal limits. The lungs are well aerated bilaterally. No focal infiltrate or sizable effusion is seen. No acute bony abnormality is noted. Calcified granuloma is again noted in the left apex. IMPRESSION: No active  disease. Electronically Signed   By: Alcide Clever M.D.   On: January 23, 2016 14:43   ROS:  please see H&P above            Blood pressure 210/99, pulse 39, temperature 98.5 F (36.9 C), temperature source Oral, resp. rate 21, SpO2 98 %.  Physical exam:   General-- pleasant white male who answers appropriately. Multiple ecchymosis overall extremities ENT-- nonicteric  Heart-- regular rate and rhythm without murmurs or gallops Lungs-- clear Abdomen-- soft and completely nontender Psych-- somewhat withdrawn but appears oriented   Assessment: 1. Upper G.I. bleed. Hematemesis certainly suggest upper G.I. source. He apparently has had severe reflux symptoms in the past that completely disappeared after his stroke. My suspicion is he simply lost the sensation to feel dyspepsia. I suspect he does have chronic esophagitis with the bleeding possibly due to this versus ulceration of the stomach. 2. Severe thrombocytopenia. Presentation compatible with ITP and hematology has started treatment with IV IgG 3. History of prior stroke 4. Chronic anemia. May indicate ongoing blood loss from possible upper G.I. Source. 5. COPD  Plan: agree with empiric PPI therapy. Suspect that he is bleeding from esophagitis or possible ulcer. Increasing his platelet count is likely to help the bleeding considerably and I would feel comfortable going ahead and giving them IV steroids. He will need upper endoscopy wants his platelet count is improved. We will follow him with you. I would keep him on clear liquids for now.   Ethlyn Alto JR,Charo Philipp L Jan 23, 2016, 5:30 PM   This note was created using voice recognition software and minor errors may Have occurred unintentionally. Pager: [phone] If no answer or after hours call [phone]

## 2016-01-01 NOTE — Progress Notes (Signed)
eLink Physician-Brief Progress Note Patient Name: TRISTON SKARE DOB: 1941-04-03 MRN: [id number]   Date of Service  Jan 12, 2016  HPI/Events of Note    eICU Interventions  Pt w A Fib, now PVC's and some runs of VT. Will check electrolytes now     Intervention Category Intermediate Interventions: Arrhythmia - evaluation and management  Suzana Sohail S. 01-12-16, 9:28 PM

## 2016-01-01 NOTE — ED Notes (Signed)
Pt transferred via [facility] to [facility]  2 [facility] bed 7

## 2016-01-01 NOTE — Progress Notes (Signed)
CRITICAL VALUE ALERT  Critical value received:  K 6.2  Date of notification:  01/28/2016  Time of notification:  2221  Critical value read back:Yes.    Nurse who received alert:  Malak Orantes Thompson   MD notified (1st page): yes  Time of first page:  2221  MD notified (2nd page):  Time of second page:  Responding MD:  Delton Coombes  Time MD responded:  2230

## 2016-01-01 NOTE — ED Provider Notes (Addendum)
[facility] Emergency Department Provider Note  Time seen: 8:29 AM  I have reviewed the triage vital signs and the nursing notes.   HISTORY  Chief Complaint Hematemesis    HPI Frank Gentry is a 75 y.o. male with a past medical history of CVA with left hemiplegia, diabetes, BPH, hypertension, hyperlipidemia, depression, who presents to the emergency department after vomiting blood. The patient currently lives at [facility] facility, they noted that the patient vomited blood twice this morning and had a bowel movement which appeared to be bloody as well. Per patient and family no history of GI bleeding in the past. Patient takes Plavix as his only blood thinner per MAR. Patient states mild upper abdominal pain. States he was feeling well and ate dinner last night like normal.     No past medical history on file.  There are no active problems to display for this patient.   No past surgical history on file.  No current outpatient prescriptions on file.  Allergies Review of patient's allergies indicates not on file.  No family history on file.  Social History Social History  Substance Use Topics  . Smoking status: Not on file  . Smokeless tobacco: Not on file  . Alcohol Use: Not on file    Review of Systems Constitutional: Negative for fever. Cardiovascular: Negative for chest pain. Respiratory: Negative for shortness of breath. Gastrointestinal: Mild upper abdominal pain. Positive for bloody vomit. Genitourinary: Negative for dysuria. Musculoskeletal: Negative for back pain. Skin: Negative for rash. Neurological: Negative for headache 10-point ROS otherwise negative.  ____________________________________________   PHYSICAL EXAM:  Constitutional: Alert. No distress. Eyes: Normal exam ENT   Head: Normocephalic and atraumatic.   Mouth/Throat: Mucous membranes are moist. Cardiovascular: Normal rate, regular  rhythm Respiratory: Normal respiratory effort without tachypnea nor retractions. Breath sounds are clear  Gastrointestinal: Soft, mild upper abdominal tenderness palpation. No rebound or guarding. No distention. Musculoskeletal: Nontender with normal range of motion in all extremities. Neurologic:  Normal speech and language. No gross focal neurologic deficits Skin:  Skin is warm and dry, somewhat pale in appearance. Psychiatric: Mood and affect are normal. Speech and behavior are normal.  ____________________________________________    INITIAL IMPRESSION / ASSESSMENT AND PLAN / ED COURSE  Pertinent labs & imaging results that were available during my care of the patient were reviewed by me and considered in my medical decision making (see chart for details).  Patient presents with upper abdominal discomfort, with bloody vomit this morning, possible bloody bowel movement as well. Patient has mild tenderness to palpation in the upper abdomen. Patient does appear somewhat pale in appearance. States he has vomited twice this morning both with bright red blood. Denies any history of GI bleed in the past. Denies any history of liver disease or alcoholism. We will check labs, I have ordered IV fluids, octreotide and Protonix.  ----------------------------------------- 9:03 AM on 01/13/2016 -----------------------------------------  Patient has had a very large bowel movement, black in color very strongly guaiac positive. Given his likely brisk gastrointestinal bleed I have ordered 2 units of emergency release blood. Patient appears quite pale, family confirms that the patient is quite pale today. Currently awaiting lab results. Patient will require ICU admission. We will discuss with GI medicine.  ----------------------------------------- 9:13 AM on 01/24/2016 ----------------------------------------- Hemoglobin has resulted at 7.0, I discussed the patient with Dr. Bluford Kaufmann of GI medicine. We'll  admit to the hospitalist service, patient will likely require intensive care unit with the plan for  endoscopy at some point.   ----------------------------------------- 10:13 AM on 01/21/2016 -----------------------------------------  Labs have resulted now showing a platelet count of less than 5. 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 We will discuss with hematology, possibly transfer to a tertiary care center if needed.   ----------------------------------------- 11:55 AM on 01/10/2016 -----------------------------------------  Smear does not show any obvious RBC fragmentation. I discussed with [facility] intensive care unit, they accepted the patient has a transfer. Patient will be transferred to [facility]. Unfortunately do not have any platelets available at our facility for transfusion, I believe by the time were able to obtain platelets from a outside facility the patient would likely be transferred. We will hold off on platelet transfusion until the patient is transferred to [facility]. We'll continue with RBC transfusion in the emergency department. I discussed this plan. The patient and family they are agreeable.   ----------------------------------------- 1:43 PM on 01/14/2016 -----------------------------------------  Continue to wait ICU bed availability at [facility]. Patient had another large emesis of bright red blood in the emergency department. A third IV was started, 2 more units of blood transfused. As we do not have platelets available in our hospital. I have now called them back seeing if they would be able to take the patient to [facility] ER where they can begin platelet transfusion will awaiting ICU bed availability. I'm currently awaiting to hear back.  ----------------------------------------- 2:43 PM on 01/20/2016 -----------------------------------------  Patient now has a bed at [facility] count, transport team is in the emergency department getting rated take the patient. I  have obtained a portal chest x-ray to rule out pulmonary edema, chest x-ray on my read appears clear. Patient's blood pressure has increased, 230 systolic. I stopped the transfusion given the possibility of a transfusion reaction. Patient continues to appear overall well, calm, cooperative, no distress. Repeat temperature he remains afebrile. Patient does appear to be in irregular rhythm currently around 100 bpm. Transport team discuss the patient with the intensivist who wishes to dose labetalol IV prior to transport. Patient's current blood pressure is 193/75. We'll dose 10 mg of labetalol IV prior to transport to [facility] con hospital.  After transfer to critical care stretcher, patient's blood pressure is 148. We will hold off on labetalol medication. Critical care team agreeable to plan, currently [facility]. Patient is leaving the hospital awake, alert, answering questions properly.   CRITICAL CARE Performed by: Minna Antis   Total critical care time: 120 minutes  Critical care time was exclusive of separately billable procedures and treating other patients.  Critical care was necessary to treat or prevent imminent or life-threatening deterioration.  Critical care was time spent personally by me on the following activities: development of treatment plan with patient and/or surrogate as well as nursing, discussions with consultants, evaluation of patient's response to treatment, examination of patient, obtaining history from patient or surrogate, ordering and performing treatments and interventions, ordering and review of laboratory studies, ordering and review of radiographic studies, pulse oximetry and re-evaluation of patient's condition.   ____________________________________________   FINAL CLINICAL IMPRESSION(S) / ED DIAGNOSES  Upper gastrointestinal bleed Thrombocytopenia  Minna Antis, MD 01/24/2016 1156  Minna Antis,  MD 01/13/2016 1459

## 2016-01-01 NOTE — ED Notes (Signed)
Pt arrives via [facility] from the [facility]  pt awakened this am with reports of feeling "hot"  he then began vomiting and reported bright red blood in his emesis   Pt with dried blood in his nose upon arrival

## 2016-01-01 NOTE — Progress Notes (Signed)
eLink Physician-Brief Progress Note Patient Name: Frank Gentry DOB: [birth year] MRN: [id number]   Date of Service  01/12/2016  HPI/Events of Note  Hyperkalemia on pm BMP  eICU Interventions  - calcium - d50 - insulin 10u IV - defer kayexalate for now since he is NPO - follow BMP     Intervention Category Intermediate Interventions: Electrolyte abnormality - evaluation and management  Frank Gentry S. 01/18/2016, 10:34 PM

## 2016-01-01 NOTE — Progress Notes (Signed)
See consult note

## 2016-01-01 NOTE — Consult Note (Signed)
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 male is here because of thrombocytopenia. The patient was transferred from [facility] after presentation with acute GI bleed. This patient had multiple different medical comorbidities and had been independent on skilled nursing facility for the past 6 years after stroke with left hemiparesis. He was noted to have acute onset of abdominal discomfort and passage of bright red blood per rectum. He denies hematemesis The local physician ordered multiple blood tests and he was noted to have significant microcytic anemia with severe thrombocytopenia and acute renal failure. The patient denies prior history of bleeding. He has extensive bruises on both upper extremities chronically, not new. Family members noted evidence of petechiae hemorrhage on both lower extremity starting recently. He had recent conjunctiva hemorrhage on the corner of the right eye for about a week He has been unwell about a week ago with some chronic nonproductive cough. He denies fever or chills. He denies dysuria, frequency or urgency. He had mild nasal drainage. The patient is on chronic antiplatelet agents with copy to grow due to prior history of stroke Looking back on his prior blood work, he was noted to have progressive microcytic anemia since April of last year. His baseline platelet count around May 2016 was within normal limits at 169,000. The patient denies history of liver disease, exposure to heparin, history of cardiac murmur/prior cardiovascular surgery or recent new medications He denies prior blood or platelet transfusions  MEDICAL HISTORY:  Past Medical History  Diagnosis Date  . Essential hypertension   .  Hyperlipidemia   . 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 History of CVA (cerebrovascular accident)   . COPD (chronic obstructive pulmonary disease) (HCC)   . BPH (benign prostatic hyperplasia)   . Depression   . Anxiety   . Hypothyroidism     SURGICAL HISTORY: No past surgical history on file.  SOCIAL HISTORY: Social History   Social History  . Marital Status: Divorced    Spouse Name: N/A  . Number of Children: N/A  . Years of Education: N/A   Occupational History  . Not on file.   Social History Main Topics  . Smoking status: Former Smoker -- 1.00 packs/day for 30 years    Types: Cigarettes  . Smokeless tobacco: Not on file  . Alcohol Use: No  . Drug Use: No  . Sexual Activity: Not on file   Other Topics Concern  . Not on file   Social History Narrative  . No narrative on file    FAMILY HISTORY: Family History  Problem Relation Age of Onset  . CVA Brother     ALLERGIES:  has No Known Allergies.  MEDICATIONS:  Current Facility-Administered Medications  Medication Dose Route Frequency Provider Last Rate Last Dose  . 0.9 %  sodium chloride infusion  250 mL Intravenous PRN Jeanella Craze, NP      . 0.9 %  sodium chloride infusion   Intravenous Continuous Jeanella Craze, NP 50 mL/hr at 01/03/2016 1655    . albuterol (PROVENTIL) (2.5 MG/3ML) 0.083% nebulizer solution 2.5 mg  2.5 mg Nebulization Q3H PRN Jeanella Craze, NP      . amLODipine (NORVASC) tablet 2.5 mg  2.5 mg Oral Daily  Jeanella Craze, NP      . cloNIDine (CATAPRES) tablet 0.1 mg  0.1 mg Oral QHS Jeanella Craze, NP      . hydrALAZINE (APRESOLINE) injection 10-40 mg  10-40 mg Intravenous Q4H PRN Duayne Cal, NP      . Immune Globulin 10% (OCTAGAM) IV infusion 75 g  1 g/kg Intravenous Q24H Gordan Grell, MD      . insulin aspart (novoLOG) injection 0-15 Units  0-15 Units Subcutaneous 6 times per day Jeanella Craze, NP   11 Units at 01/06/2016 1654  . [START ON 01/02/2016] levothyroxine  (SYNTHROID, LEVOTHROID) tablet 25 mcg  25 mcg Oral QAC breakfast Jeanella Craze, NP      . octreotide (SANDOSTATIN) 500 mcg in sodium chloride 0.9 % 250 mL (2 mcg/mL) infusion  50 mcg/hr Intravenous Continuous Duayne Cal, NP      . ondansetron (ZOFRAN) injection 4 mg  4 mg Intravenous Q6H PRN Jeanella Craze, NP      . pantoprazole (PROTONIX) 80 mg in sodium chloride 0.9 % 250 mL (0.32 mg/mL) infusion  8 mg/hr Intravenous Continuous Duayne Cal, NP        REVIEW OF SYSTEMS:   Constitutional: Denies fevers, chills or abnormal night sweats Eyes: Denies blurriness of vision, double vision or watery eyes Ears, nose, mouth, throat, and face: Denies mucositis or sore throat Respiratory: Denies cough, dyspnea or wheezes Cardiovascular: Denies palpitation, chest discomfort or lower extremity swelling Lymphatics: Denies new lymphadenopathy or easy bruising Neurological:Denies numbness, tingling or new weaknesses Behavioral/Psych: Mood is stable, no new changes  All other systems were reviewed with the patient and are negative.  PHYSICAL EXAMINATION: ECOG PERFORMANCE STATUS: 3 - Symptomatic, >50% confined to bed  Filed Vitals:   01/28/2016 1630 01/30/2016 1645  BP: 158/92 137/87  Pulse: 92 39  Temp:    Resp: 17 21   There were no vitals filed for this visit.  GENERAL:alert, no distress and comfortable SKIN: He has extensive petechiae hemorrhages on both legs and over his shoulders and extensively on both upper extremities EYES: normal, conjunctiva are pale with pink eye over the right eye OROPHARYNX:no exudate, no erythema and lips, buccal mucosa, and tongue normal  NECK: supple, thyroid normal size, non-tender, without nodularity LYMPH:  no palpable lymphadenopathy in the cervical, axillary or inguinal LUNGS: clear to auscultation and percussion with normal breathing effort HEART: regular rate & rhythm and no murmurs and no lower extremity edema ABDOMEN:abdomen soft, non-tender and  normal bowel sounds Musculoskeletal:no cyanosis of digits and no clubbing  PSYCH: alert & oriented x 3 with fluent speech.  NEURO: He has weakness left greater than right  LABORATORY DATA:  I have reviewed the data as listed CBC    Component Value Date/Time   WBC 9.6 01/14/2016 0822   WBC 6.2 02/11/2015 1735   RBC 3.00* 01/20/2016 0822   RBC 4.71 02/11/2015 1735   HGB 7.0* 01/23/2016 0822   HGB 12.2* 02/11/2015 1735   HCT 21.7* 12/31/2015 0822   HCT 37.3* 02/11/2015 1735   PLT <5* 01/10/2016 0822   PLT 190 02/11/2015 1735   MCV 72.2* 01/11/2016 0822   MCV 79* 02/11/2015 1735   MCH 23.2* 01/30/2016 0822   MCH 25.9* 02/11/2015 1735   MCHC 32.1 01/20/2016 0822   MCHC 32.7 02/11/2015 1735   RDW 18.1* 01/19/2016 0822   RDW 14.0 02/11/2015 1735   LYMPHSABS 1.8 03/27/2015 0830   LYMPHSABS 1.7 02/11/2015 1735  MONOABS 0.3 03/27/2015 0830   MONOABS 0.4 02/11/2015 1735   EOSABS 0.8* 03/27/2015 0830   EOSABS 0.7 02/11/2015 1735   BASOSABS 0.1 03/27/2015 0830   BASOSABS 0.0 02/11/2015 1735    RADIOGRAPHIC STUDIES: I have personally reviewed the radiological images as listed and agreed with the findings in the report. Dg Chest Portable 1 View  January 11, 2016  CLINICAL DATA:  Hematemesis and hematochezia EXAM: PORTABLE CHEST 1 VIEW COMPARISON:  10/31/2009 FINDINGS: Cardiac shadow is within normal limits. The lungs are well aerated bilaterally. No focal infiltrate or sizable effusion is seen. No acute bony abnormality is noted. Calcified granuloma is again noted in the left apex. IMPRESSION: No active disease. Electronically Signed   By: Alcide Clever M.D.   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 I recommend platelet transfusion in view of recent  severe GI bleed After that, as long as his platelet count is greater than 10,000 and in the absence of bleeding, he can be monitored without needing further platelet transfusion The risks and benefits of IVIG treatment were fully discussed with patient and family members and they agree to proceed. I discussed my concern for possible ulcer bleeding with high-dose steroids but was reassured by the gastroenterologist that that would be an acceptable risk especially given the fact that the patient has been started on IV Protonix and octreotide infusion  #2 recent flulike illness I recommend swab for influenza  #3 progressive microcytic anemia I suspect the patient may have slow GI bleed. He is on Plavix for stroke prevention. Recommend holding off for now He may benefit from GI evaluation with endoscopy in the near future once his platelet count recovers  #4 acute on chronic renal failure This could be exacerbated by recent GI bleed. He would benefit from hydration and we will monitor his kidney function carefully  #5 discharge planning He is not stable to be transferred out of the ICU Continue aggressive supportive care I will follow closely  Plan of care is fully discussed with patient, family members and referring services

## 2016-01-02 ENCOUNTER — Inpatient Hospital Stay (HOSPITAL_COMMUNITY): Payer: Medicare Other

## 2016-01-02 LAB — TYPE AND SCREEN
ABO/RH(D): O POS
ANTIBODY SCREEN: NEGATIVE
UNIT DIVISION: 0
UNIT DIVISION: 0
Unit division: 0
Unit division: 0
Unit division: 0
Unit division: 0

## 2016-01-02 LAB — CBC
HCT: 20.4 % — ABNORMAL LOW (ref 39.0–52.0)
HCT: 26.1 % — ABNORMAL LOW (ref 39.0–52.0)
HEMATOCRIT: 21.1 % — AB (ref 39.0–52.0)
HEMOGLOBIN: 6.6 g/dL — AB (ref 13.0–17.0)
HEMOGLOBIN: 7.2 g/dL — AB (ref 13.0–17.0)
HEMOGLOBIN: 8.4 g/dL — AB (ref 13.0–17.0)
MCH: 25.6 pg — ABNORMAL LOW (ref 26.0–34.0)
MCH: 27.3 pg (ref 26.0–34.0)
MCH: 26.3 pg (ref 26.0–34.0)
MCHC: 32.4 g/dL (ref 30.0–36.0)
MCHC: 34.1 g/dL (ref 30.0–36.0)
MCHC: 32.2 g/dL (ref 30.0–36.0)
MCV: 79.1 fL (ref 78.0–100.0)
MCV: 79.9 fL (ref 78.0–100.0)
MCV: 81.8 fL (ref 78.0–100.0)
PLATELETS: 171 10*3/uL (ref 150–400)
Platelets: 107 10*3/uL — ABNORMAL LOW (ref 150–400)
Platelets: 84 10*3/uL — ABNORMAL LOW (ref 150–400)
RBC: 2.58 MIL/uL — ABNORMAL LOW (ref 4.22–5.81)
RBC: 2.64 MIL/uL — AB (ref 4.22–5.81)
RBC: 3.19 MIL/uL — AB (ref 4.22–5.81)
RDW: 16.5 % — ABNORMAL HIGH (ref 11.5–15.5)
RDW: 16.6 % — ABNORMAL HIGH (ref 11.5–15.5)
RDW: 17 % — ABNORMAL HIGH (ref 11.5–15.5)
WBC: 15.3 10*3/uL — ABNORMAL HIGH (ref 4.0–10.5)
WBC: 11.6 10*3/uL — AB (ref 4.0–10.5)
WBC: 29.1 10*3/uL — ABNORMAL HIGH (ref 4.0–10.5)

## 2016-01-02 LAB — MAGNESIUM: MAGNESIUM: 1.9 mg/dL (ref 1.7–2.4)

## 2016-01-02 LAB — BASIC METABOLIC PANEL
Anion gap: 15 (ref 5–15)
BUN: 59 mg/dL — ABNORMAL HIGH (ref 6–20)
CHLORIDE: 102 mmol/L (ref 101–111)
CO2: 19 mmol/L — ABNORMAL LOW (ref 22–32)
CREATININE: 2.47 mg/dL — AB (ref 0.61–1.24)
Calcium: 8.3 mg/dL — ABNORMAL LOW (ref 8.9–10.3)
GFR, EST AFRICAN AMERICAN: 28 mL/min — AB (ref >60)
GFR, EST NON AFRICAN AMERICAN: 24 mL/min — AB (ref >60)
Glucose, Bld: 291 mg/dL — ABNORMAL HIGH (ref 65–99)
POTASSIUM: 4.9 mmol/L (ref 3.5–5.1)
SODIUM: 136 mmol/L (ref 135–145)

## 2016-01-02 LAB — GLUCOSE, CAPILLARY
GLUCOSE-CAPILLARY: 208 mg/dL — AB (ref 65–99)
GLUCOSE-CAPILLARY: 262 mg/dL — AB (ref 65–99)
GLUCOSE-CAPILLARY: 270 mg/dL — AB (ref 65–99)
GLUCOSE-CAPILLARY: 271 mg/dL — AB (ref 65–99)
GLUCOSE-CAPILLARY: 277 mg/dL — AB (ref 65–99)
GLUCOSE-CAPILLARY: 305 mg/dL — AB (ref 65–99)
Glucose-Capillary: 238 mg/dL — ABNORMAL HIGH (ref 65–99)

## 2016-01-02 LAB — HAPTOGLOBIN: Haptoglobin: 106 mg/dL (ref 34–200)

## 2016-01-02 LAB — TROPONIN I
Troponin I: 0.06 ng/mL — ABNORMAL HIGH (ref <0.031)
Troponin I: 0.39 ng/mL — ABNORMAL HIGH (ref <0.031)

## 2016-01-02 LAB — PREPARE PLATELET PHERESIS: UNIT DIVISION: 0

## 2016-01-02 LAB — PREPARE RBC (CROSSMATCH)

## 2016-01-02 LAB — PHOSPHORUS: PHOSPHORUS: 3 mg/dL (ref 2.5–4.6)

## 2016-01-02 LAB — PATHOLOGIST SMEAR REVIEW

## 2016-01-02 MED ORDER — DIPHENHYDRAMINE HCL 50 MG/ML IJ SOLN
25.0000 mg | Freq: Once | INTRAMUSCULAR | Status: AC
  Administered 2016-01-02: 25 mg via INTRAVENOUS
  Filled 2016-01-02: qty 1

## 2016-01-02 MED ORDER — VENLAFAXINE HCL 37.5 MG PO TABS
37.5000 mg | ORAL_TABLET | Freq: Two times a day (BID) | ORAL | Status: DC
  Administered 2016-01-02 – 2016-01-03 (×2): 37.5 mg via ORAL
  Filled 2016-01-02 (×4): qty 1

## 2016-01-02 MED ORDER — DEXMEDETOMIDINE HCL IN NACL 200 MCG/50ML IV SOLN
0.2000 ug/kg/h | INTRAVENOUS | Status: DC
  Administered 2016-01-02: 0.5 ug/kg/h via INTRAVENOUS
  Administered 2016-01-02: 0.2 ug/kg/h via INTRAVENOUS
  Filled 2016-01-02 (×2): qty 50

## 2016-01-02 MED ORDER — INSULIN ASPART 100 UNIT/ML ~~LOC~~ SOLN
0.0000 [IU] | SUBCUTANEOUS | Status: DC
  Administered 2016-01-02: 11 [IU] via SUBCUTANEOUS
  Administered 2016-01-02: 7 [IU] via SUBCUTANEOUS
  Administered 2016-01-02 – 2016-01-03 (×5): 11 [IU] via SUBCUTANEOUS
  Administered 2016-01-03 (×2): 7 [IU] via SUBCUTANEOUS
  Administered 2016-01-04: 4 [IU] via SUBCUTANEOUS
  Administered 2016-01-04: 7 [IU] via SUBCUTANEOUS

## 2016-01-02 MED ORDER — SODIUM CHLORIDE 0.9 % IV SOLN
Freq: Once | INTRAVENOUS | Status: AC
  Administered 2016-01-02: 05:00:00 via INTRAVENOUS

## 2016-01-02 MED ORDER — PANTOPRAZOLE SODIUM 40 MG IV SOLR
40.0000 mg | Freq: Two times a day (BID) | INTRAVENOUS | Status: DC
  Administered 2016-01-03 – 2016-01-04 (×4): 40 mg via INTRAVENOUS
  Filled 2016-01-02 (×7): qty 40

## 2016-01-02 MED ORDER — LORAZEPAM 2 MG/ML IJ SOLN
0.5000 mg | INTRAMUSCULAR | Status: DC | PRN
Start: 2016-01-02 — End: 2016-01-02
  Administered 2016-01-02: 1 mg via INTRAVENOUS
  Filled 2016-01-02: qty 1

## 2016-01-02 MED ORDER — HALOPERIDOL LACTATE 5 MG/ML IJ SOLN
5.0000 mg | Freq: Once | INTRAMUSCULAR | Status: AC
  Administered 2016-01-02: 5 mg via INTRAVENOUS
  Filled 2016-01-02: qty 1

## 2016-01-02 MED FILL — Labetalol HCl IV Soln 5 MG/ML: INTRAVENOUS | Qty: 4 | Status: AC

## 2016-01-02 NOTE — Progress Notes (Signed)
IVIG stopped per Dr. Delton CoombesByrum. RN will continue to monitor pt closely including 15min vital checks.

## 2016-01-02 NOTE — Progress Notes (Signed)
D50 was held due to pt serum glucose of 353 and finger stick glucose of 326. Dr. Darrick Pennaeterding was made aware. Pt received 10u IV and calcium glu for hyperkalemia as ordered.

## 2016-01-02 NOTE — Progress Notes (Signed)
eLink Physician-Brief Progress Note Patient Name: Frank MiyamotoRoy L Juncaj DOB: 1941-05-04 MRN: [id number]   Date of Service  01/02/2016  HPI/Events of Note  Ongoing issues with agitation.  No response to ativan.  QTc greater than 500.  eICU Interventions  Plan: Start precedex for agitation     Intervention Category Major Interventions: Delirium, psychosis, severe agitation - evaluation and management  DETERDING,ELIZABETH 01/02/2016, 4:52 AM

## 2016-01-02 NOTE — Progress Notes (Signed)
CRITICAL VALUE ALERT  Critical value received:  hgb 6.6  Date of notification:  01/02/16  Time of notification:  0400  Critical value read back:Yes.    Nurse who received alert:  Camdyn Beske Thompson  MD notified (1st page):  yes  Time of first page:  0401  MD notified (2nd page):  Time of second page:  Responding MD:  Deterding  Time MD responded:  [phone]

## 2016-01-02 NOTE — Progress Notes (Signed)
[facility] PROGRESS NOTE Subjective Pt notes no gross bleeding. Has received IVIG and decadron for ITP. Plts up!!! NO prior EGD or colon History of chronic dyspepsia probably due to reflux. Octreotide stopped. PPI now BID  Objective: Vital signs in last 24 hours: Temp:  [98.4 F (36.9 C)-99.2 F (37.3 C)] 98.5 F (36.9 C) (03/03 1151) Pulse Rate:  [31-120] 81 (03/03 1345) Resp:  [0-36] 24 (03/03 1345) BP: (79-210)/(46-149) 143/56 mmHg (03/03 1345) SpO2:  [94 %-100 %] 100 % (03/03 1345) Weight:  [87.5 kg (192 lb 14.4 oz)] 87.5 kg (192 lb 14.4 oz) (03/03 0500) Last BM Date: 01/02/16  Intake/Output from previous day: 03/02 0701 - 03/03 0700 In: 3069.3 [I.V.:2478.3; Blood:481; IV Piggyback:110] Out: 1205 [Urine:1205] Intake/Output this shift: Total I/O In: 1036.7 [I.V.:768.7; Blood:268] Out: 118 [Urine:118]  PE: General--alert multiple ecchymosis  Abdomen--nontender.  Lab Results:  Recent Labs  01/02/2016 0822 01/28/2016 1733 01/02/16 0340 01/02/16 0950  WBC 9.6 12.6* 15.3* 11.6*  HGB 7.0* 10.3* 6.6* 7.2*  HCT 21.7* 31.8* 20.4* 21.1*  PLT <5* <5* 107* 84*   BMET  Recent Labs  01/26/2016 0822 01/29/2016 2137 01/02/16 0340  NA 135 138 136  K 5.4* 6.2* 4.9  CL 98* 105 102  CO2 26 22 19*  CREATININE 2.17* 2.39* 2.47*   LFT  Recent Labs  01/06/2016 0822  PROT 6.5  AST 29  ALT 22  ALKPHOS 45  BILITOT 0.5   PT/INR  Recent Labs  01/26/2016 0822  LABPROT 17.0*  INR 1.37   PANCREAS  Recent Labs  12/31/2015 1610  LIPASE <10*         Studies/Results: Dg Chest Port 1 View  01/02/2016  CLINICAL DATA:  Pulmonary edema. EXAM: PORTABLE CHEST 1 VIEW COMPARISON:  01/21/2016. FINDINGS: Right PICC line in stable position. Mediastinum and hilar structures are normal . Cardiomegaly with pulmonary vascular prominence. Mild basilar atelectasis and/or infiltrates. Calcified nodule left upper lobe consistent granuloma . No pleural effusion or pneumothorax .  IMPRESSION: 1. Right PICC line in stable position. 2. Bibasilar atelectasis and/or infiltrates. 3. Stable cardiomegaly. Electronically Signed   By: Maisie Fus  Register   On: 01/02/2016 07:31   Dg Chest Port 1 View  01/24/2016  CLINICAL DATA:  Status post PICC line placement EXAM: PORTABLE CHEST 1 VIEW COMPARISON:  Film from earlier in the same day FINDINGS: Cardiac shadow is stable. The lungs are again well aerated. Slight increased density is noted in the medial aspect of the right apex. This is better visualized than on the prior exam. Two-view chest may be helpful for further evaluation. Calcified granuloma is again seen. New right-sided PICC line is noted with the catheter tip at the cavoatrial junction in satisfactory position. No bony abnormality is seen. IMPRESSION: Status post PICC line placement as described. Questionable increased density in the medial aspect of the right apex. PA and lateral chest when the patient's condition improves may be helpful. Electronically Signed   By: Alcide Clever M.D.   On: 01/30/2016 21:54   Dg Chest Portable 1 View  01/19/2016  CLINICAL DATA:  Hematemesis and hematochezia EXAM: PORTABLE CHEST 1 VIEW COMPARISON:  10/31/2009 FINDINGS: Cardiac shadow is within normal limits. The lungs are well aerated bilaterally. No focal infiltrate or sizable effusion is seen. No acute bony abnormality is noted. Calcified granuloma is again noted in the left apex. IMPRESSION: No active disease. Electronically Signed   By: Alcide Clever M.D.   On: 01/12/2016 14:43    Medications: I  have reviewed the patient's current medications.  Assessment/Plan: 1. GI Bleed. Probably UGI. Pt w/o prior EGD or colon but has had chronic dyspepsia in past. Will probably need at least EGD when plts are back to normal will continue on PPI IV for now.   Rayleigh Gillyard JR,Lanier Felty L 01/02/2016, 2:12 PM  This note was created using voice recognition software. Minor errors may Have occurred unintentionally.  Pager:  [phone] If no answer or after hours call [phone]                }

## 2016-01-02 NOTE — Progress Notes (Addendum)
PULMONARY / CRITICAL CARE MEDICINE   Name: Frank MiyamotoRoy L Gentry MRN: [id number] DOB: [birth year]    ADMISSION DATE:  01/21/2016 CONSULTATION DATE:  01/29/2016  REFERRING MD:  Dr. 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    SUBJECTIVE:  No bleeding, platelets better, received IVIg  VITAL SIGNS: BP 100/56 mmHg  Pulse 97  Temp(Src) 98.5 F (36.9 C) (Axillary)  Resp 24  Wt 87.5 kg (192 lb 14.4 oz)  SpO2 98%  HEMODYNAMICS:    VENTILATOR SETTINGS:    INTAKE / OUTPUT: I/O last 3 completed shifts: In: 3069.3 [I.V.:2478.3; Blood:481; IV Piggyback:110] Out: 1205 [Urine:1205]  PHYSICAL EXAMINATION: General:  Elderly male in NAD  Neuro:  Awake, alert, moves right side spontaneously, Left hemiparesis  HEENT:  MM pink/moist, no jvd Cardiovascular:  s1s2 irregular, SR with PAC's on monitor Lungs:  Even/non-labored, faint crackles RLL  Abdomen:  Obese/soft, bsx4 active  Musculoskeletal:  No acute deformities  Skin:  Pale, warm, dry, scattered petechiae noted   LABS:  BMET  Recent Labs Lab 01/03/2016 0822 01/24/2016 2137 01/02/16 0340  NA 135 138 136  K 5.4* 6.2* 4.9  CL 98* 105 102  CO2 26 22 19*  BUN 62* 62* 59*  CREATININE 2.17* 2.39* 2.47*  GLUCOSE 297* 354* 291*    Electrolytes  Recent Labs Lab 01/18/2016 0822 01/25/2016 2137 01/02/16 0340  CALCIUM 8.7* 8.3* 8.3*  MG  --  2.0 1.9  PHOS  --  2.4* 3.0    CBC  Recent Labs Lab 01/17/2016 1733 01/02/16 0340 01/02/16 0950  WBC 12.6* 15.3* 11.6*  HGB 10.3* 6.6* 7.2*  HCT 31.8* 20.4* 21.1*  PLT <5* 107* 84*    Coag's  Recent Labs Lab 01/03/2016 0822  APTT 29  INR 1.37    Sepsis Markers No results for input(s): LATICACIDVEN, PROCALCITON, O2SATVEN in the last 168 hours.  ABG No results for input(s): PHART, PCO2ART, PO2ART in the last 168 hours.  Liver Enzymes  Recent  Labs Lab 01/26/2016 0822  AST 29  ALT 22  ALKPHOS 45  BILITOT 0.5  ALBUMIN 3.3*    Cardiac Enzymes  Recent Labs Lab 01/11/2016 1733 01/02/16 0346  TROPONINI 0.05* 0.06*    Glucose  Recent Labs Lab 01/22/2016 1536 01/09/2016 2023 01/14/2016 2307 01/02/16 0012 01/02/16 0316 01/02/16 0753  GLUCAP 323* 287* 327* 262* 277* 305*    Imaging Dg Chest Port 1 View  01/02/2016  CLINICAL DATA:  Pulmonary edema. EXAM: PORTABLE CHEST 1 VIEW COMPARISON:  01/08/2016. FINDINGS: Right PICC line in stable position. Mediastinum and hilar structures are normal . Cardiomegaly with pulmonary vascular prominence. Mild basilar atelectasis and/or infiltrates. Calcified nodule left upper lobe consistent granuloma . No pleural effusion or pneumothorax . IMPRESSION: 1. Right PICC line in stable position. 2. Bibasilar atelectasis and/or infiltrates. 3. Stable cardiomegaly. Electronically Signed   By: Maisie Fushomas  Register   On: 01/02/2016 07:31   Dg Chest Port 1 View  01/03/2016  CLINICAL DATA:  Status post PICC line placement EXAM: PORTABLE CHEST 1 VIEW COMPARISON:  Film from earlier in the same day FINDINGS: Cardiac shadow is stable. The lungs are again well aerated. Slight increased density is noted in the medial aspect of the right apex. This is better visualized than on the prior exam. Two-view chest may be helpful for further evaluation. Calcified granuloma is again seen. New  right-sided PICC line is noted with the catheter tip at the cavoatrial junction in satisfactory position. No bony abnormality is seen. IMPRESSION: Status post PICC line placement as described. Questionable increased density in the medial aspect of the right apex. PA and lateral chest when the patient's condition improves may be helpful. Electronically Signed   By: Alcide Clever M.D.   On: 13-Jan-2016 21:54   Dg Chest Portable 1 View  13-Jan-2016  CLINICAL DATA:  Hematemesis and hematochezia EXAM: PORTABLE CHEST 1 VIEW COMPARISON:  10/31/2009  FINDINGS: Cardiac shadow is within normal limits. The lungs are well aerated bilaterally. No focal infiltrate or sizable effusion is seen. No acute bony abnormality is noted. Calcified granuloma is again noted in the left apex. IMPRESSION: No active disease. Electronically Signed   By: Alcide Clever M.D.   On: Jan 13, 2016 14:43     STUDIES:  CT head 3/3>    CULTURES: BCx2 3/2 >>  UA 3/2 >>   ANTIBIOTICS:   SIGNIFICANT EVENTS: 3/02  Admit from [facility] with GIB, started on IVIg  LINES/TUBES:   DISCUSSION: 75 y/o M admitted with acute onset GIB, ABL and profound thrombocytopenia likely due to ITP, dramatic improvement after IVIg.  ASSESSMENT / PLAN:  PULMONARY A: Hx COPD, Former Tobacco Abuse  P:   Oxygen as needed for saturations >92% PRN albuterol for wheezing   CARDIOVASCULAR A:  Hx HTN, HLD PAC's QTc prolonged Mild troponin bump in setting of demand ischemia P:  Tele monitoring Consider cardiology f/u outpatient  RENAL A:   Acute Kidney Injury - in setting of ABL  P:   Trend BMP / UOP  Replace electrolytes as indicated  Increase IVF  GASTROINTESTINAL A:   GIB - acute hematemesis > resolved P:   Clears today Change Protonix to bid Stop Octreotide gtt for now   HEMATOLOGIC A:   Anemia  Thrombocytopenia - ITP P:  IVIg per hematology Continue decadron Transfuse PRB for Hgb < 7 gm/dL  INFECTIOUS A:  No acute issues P:   Assess cultures as above Hold abx for now   ENDOCRINE A:   DM II Hypothyroidism  Hyperglycemia on steroids P:   Monitor CBG / glucose on BMP  Q4 CBG SSI increase to resistant scale Continue synthroid   NEUROLOGIC A:   Hx CVA - on plavix at baseline  Depression / Anxiety  Acute encephalopathy overnight 3/3 > non-focal exam today, polypharmacy baseline with psychoactive meds P:   RASS goal: 0 Hold Precedex Stop lorazepam Restart home effexor Hold home plavix  Restart effexor, continue to hold trazodone, lyrica,  clexa CT head if no improvement today after holding precedex   FAMILY  - Updates: daughter updated bedside 3/3  - Inter-disciplinary family meet or Palliative Care meeting due by:  01/08/16  My cc time 35 minutes  Heber [place], MD [place] [facility] Pager: [phone] Cell: [phone] After 3pm or if no response, call [phone]

## 2016-01-02 NOTE — Progress Notes (Signed)
Inpatient Diabetes Program Recommendations  AACE/ADA: New Consensus Statement on Inpatient Glycemic Control (2015)  Target Ranges:  Prepandial:   less than 140 mg/dL      Peak postprandial:   less than 180 mg/dL (1-2 hours)      Critically ill patients:  140 - 180 mg/dL   Review of Glycemic Control  Diabetes history: DM2 Outpatient Diabetes medications: 75/25 100 units QAM, 55 units QPM Current orders for Inpatient glycemic control: Novolog resistant Q4H  Results for Frank, Gentry (MRN [id number]) as of 01/02/2016 14:01  Ref. Range 01/13/2016 23:07 01/02/2016 00:12 01/02/2016 03:16 01/02/2016 07:53 01/02/2016 11:43  Glucose-Capillary Latest Ref Range: 65-99 mg/dL 327 (H) 262 (H) 277 (H) 305 (H) 271 (H)  Results for Frank, Gentry (MRN [id number]) as of 01/02/2016 14:01  Ref. Range 01/02/2016 03:40  Sodium Latest Ref Range: 135-145 mmol/L 136  Potassium Latest Ref Range: 3.5-5.1 mmol/L 4.9  Chloride Latest Ref Range: 101-111 mmol/L 102  CO2 Latest Ref Range: 22-32 mmol/L 19 (L)  BUN Latest Ref Range: 6-20 mg/dL 59 (H)  Creatinine Latest Ref Range: 0.61-1.24 mg/dL 2.47 (H)  Calcium Latest Ref Range: 8.9-10.3 mg/dL 8.3 (L)  EGFR (Non-African Amer.) Latest Ref Range: >60 mL/min 24 (L)  EGFR (African American) Latest Ref Range: >60 mL/min 28 (L)  Glucose Latest Ref Range: 65-99 mg/dL 291 (H)  Anion gap Latest Ref Range: 5-15  15  Needs basal insulin.  Inpatient Diabetes Program Recommendations:    Recommend ICU Hyperglycemia Protocol.  Will continue to follow. Thank you. Lorenda Peck, RD, LDN, CDE Inpatient Diabetes Coordinator [phone]

## 2016-01-02 NOTE — Progress Notes (Signed)
Frank Gentry   DOB:10/01/1941   ZO#:[id number]#:[id number]    Subjective: Hospital day 2, admitted yesterday after presentation with GI bleed and severe thrombocytopenia, likely due to new onset of ITP. The patient was on Plavix, discontinued and he received blood transfusions along with IVIG and IV dexamethasone. Overnight, he have to more bloody bowel movement but nothing since this morning. Denies recent hematuria or epistaxis The patient had intermittent confusion but appears to be arousable and oriented  Objective:  Filed Vitals:   01/02/16 1200 01/02/16 1215  BP: 112/56 122/55  Pulse: 72 85  Temp:    Resp: 23 33     Intake/Output Summary (Last 24 hours) at 01/02/16 1300 Last data filed at 01/02/16 1200  Gross per 24 hour  Intake 4020.94 ml  Output   1323 ml  Net 2697.94 ml    GENERAL:alert, no distress and comfortable SKIN: He has diffuse petechiae rash. Also multiple bruising on both upper extremities  EYES: normal, Conjunctiva are pink and non-injected, sclera clear OROPHARYNX:no exudate, no erythema and lips, buccal mucosa, and tongue normal  NECK: supple, thyroid normal size, non-tender, without nodularity LYMPH:  no palpable lymphadenopathy in the cervical, axillary or inguinal LUNGS: clear to auscultation and percussion with normal breathing effort HEART: regular rate & rhythm and no murmurs and no lower extremity edema ABDOMEN:abdomen soft, non-tender and normal bowel sounds. Musculoskeletal:no cyanosis of digits and no clubbing  NEURO: alert & oriented x 3 with fluent speech, with left-sided weakness   Labs:  Lab Results  Component Value Date   WBC 11.6* 01/02/2016   HGB 7.2* 01/02/2016   HCT 21.1* 01/02/2016   MCV 79.9 01/02/2016   PLT 84* 01/02/2016   NEUTROABS 3.6 03/27/2015    Lab Results  Component Value Date   NA 136 01/02/2016   K 4.9 01/02/2016   CL 102 01/02/2016   CO2 19* 01/02/2016    Studies:  Dg Chest Port 1 View  01/02/2016  CLINICAL DATA:   Pulmonary edema. EXAM: PORTABLE CHEST 1 VIEW COMPARISON:  07-13-16. FINDINGS: Right PICC line in stable position. Mediastinum and hilar structures are normal . Cardiomegaly with pulmonary vascular prominence. Mild basilar atelectasis and/or infiltrates. Calcified nodule left upper lobe consistent granuloma . No pleural effusion or pneumothorax . IMPRESSION: 1. Right PICC line in stable position. 2. Bibasilar atelectasis and/or infiltrates. 3. Stable cardiomegaly. Electronically Signed   By: Maisie Fushomas  Register   On: 01/02/2016 07:31   Dg Chest Port 1 View  01/20/2016  CLINICAL DATA:  Status post PICC line placement EXAM: PORTABLE CHEST 1 VIEW COMPARISON:  Film from earlier in the same day FINDINGS: Cardiac shadow is stable. The lungs are again well aerated. Slight increased density is noted in the medial aspect of the right apex. This is better visualized than on the prior exam. Two-view chest may be helpful for further evaluation. Calcified granuloma is again seen. New right-sided PICC line is noted with the catheter tip at the cavoatrial junction in satisfactory position. No bony abnormality is seen. IMPRESSION: Status post PICC line placement as described. Questionable increased density in the medial aspect of the right apex. PA and lateral chest when the patient's condition improves may be helpful. Electronically Signed   By: Alcide CleverMark  Lukens M.D.   On: 07-13-16 21:54   Dg Chest Portable 1 View  01/07/2016  CLINICAL DATA:  Hematemesis and hematochezia EXAM: PORTABLE CHEST 1 VIEW COMPARISON:  10/31/2009 FINDINGS: Cardiac shadow is within normal limits. The lungs are well aerated  bilaterally. No focal infiltrate or sizable effusion is seen. No acute bony abnormality is noted. Calcified granuloma is again noted in the left apex. IMPRESSION: No active disease. Electronically Signed   By: Alcide Clever M.D.   On: 01/25/2016 14:43    Assessment & Plan:   #1 severe thrombocytopenia, likely ITP His presentation is  compatible with ITP, precipitated by recent flu-like illness I recommend IVIG 1 g/kg daily 2 days and IV dexamethasone 40 mg daily 4 days, started on Jan 25, 2016 with excellent response to treatment He received 1 unit of platelets on January 25, 2016  As long as his platelet count is greater than 10,000 and in the absence of bleeding, he can be monitored without needing further platelet transfusion The risks and benefits of IVIG treatment were fully discussed with patient and family members and they agree to proceed. I discussed my concern for possible ulcer bleeding with high-dose steroids but was reassured by the gastroenterologist that that would be an acceptable risk especially given the fact that the patient has been started on IV Protonix and octreotide infusion  #2 recent flulike illness I recommend swab for influenza  #3 progressive microcytic anemia, recent GI bleed I suspect the patient may have slow GI bleed. He is on Plavix for stroke prevention. Recommend holding off for now He may benefit from GI evaluation with endoscopy in the near future once his platelet count recovers I will defer to primary service to determine transfusion threshold  #4 acute on chronic renal failure with recent hyperkalemia  This could be exacerbated by recent GI bleed. He would benefit from hydration and we will monitor his kidney function carefully He is followed closely by nephrologist  #5 Leukocytosis Due to steroids. Observe for now  #6 discharge planning He is not stable to be transferred out of the ICU Continue aggressive supportive care I will follow closely  Plan of care is fully discussed with patient, family members and referring services I will return next week for further follow-up. Please consult hematologist on call over the weekend if questions arise  [facility], Machai Desmith, MD 01/02/2016  1:00 PM

## 2016-01-02 NOTE — Progress Notes (Signed)
IVIG restarted after benedryl given will monitor VS Q15 mins. Suzy Bouchardhompson, Wynonia Medero E, [place]RN 01/02/2016 [phone]

## 2016-01-02 NOTE — Progress Notes (Signed)
eLink Physician-Brief Progress Note Patient Name: Frank MiyamotoRoy L Roethler DOB: Jan 19, 1941 MRN: [id number]   Date of Service  01/02/2016  HPI/Events of Note  Patient c/o of anxiety.  HD stable with no reported ETOH history.  Is oriented.  eICU Interventions  Plan: PRN ativan 0.5 to 1mg  IV q4 hours     Intervention Category Minor Interventions: Agitation / anxiety - evaluation and management  DETERDING,ELIZABETH 01/02/2016, 2:42 AM

## 2016-01-02 NOTE — Progress Notes (Signed)
eLink Physician-Brief Progress Note Patient Name: Frank MiyamotoRoy L Fort DOB: Feb 18, 1941 MRN: [id number]   Date of Service  01/02/2016  HPI/Events of Note  Have attempted to restart IVIg after benadryl given, appears rto be tolerating. Has remains somewhat agitated. Would like to avoid restart precedex, try haldol now. Will need head Ct 3/4 if not resolving  eICU Interventions       Intervention Category Intermediate Interventions: Other:  Aleene Swanner S. 01/02/2016, 8:38 PM

## 2016-01-02 NOTE — Clinical Social Work Note (Signed)
Clinical Social Work Assessment  Patient Details  Name: Frank Gentry MRN: [id number] Date of Birth: 07-04-1941  Date of referral:  01/02/16               Reason for consult:  Facility Placement                Permission sought to share information with:  Facility Medical sales representativeContact Representative, Family Supports Permission granted to share information::  Yes, Verbal Permission Granted  Name::      Frank Gentry(Pam Gillespie- Daughter [phone]))  [facility]  Relationship::     Contact Information:     Housing/Transportation Living arrangements for the past 2 months:  Skilled Building surveyorursing Facility Source of Information:  Adult Children Patient Interpreter Needed:  None Criminal Activity/Legal Involvement Pertinent to Current Situation/Hospitalization:  No - Comment as needed Significant Relationships:    Lives with:  Spouse, Adult Children Do you feel safe going back to the place where you live?  Yes Need for family participation in patient care:  Yes (Comment)  Care giving concerns:  Patient's daughter reports no care giving concerns at this time.    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 CSW engaged with Patient and his daughter at Patient's bedside. [facility] [place]. Patient and Patient's daughter would like Patient to return upon discharge. CSW will [facility] when Patient is medically stable and cleared for discharge. CSW will continue to follow for disposition.   Employment status:  Retired Health and safety inspectornsurance information:  Medicare PT Recommendations:  Not assessed at this time Information / Referral to community resources:  Skilled Nursing Facility  Patient/Family's Response to care:  Patient and Patient's daughter appreciative of care received at this time.   Patient/Family's  Understanding of and Emotional Response to Diagnosis, Current Treatment, and Prognosis:  Patient/Family verbalize strong understanding of Patient's current diagnosis, current treatment, and prognosis.  Emotional Assessment Appearance:  Appears stated age Attitude/Demeanor/Rapport:   (Cooperative; Appropriate) Affect (typically observed):  Appropriate Orientation:  Oriented to Self, Oriented to Place, Oriented to  Time, Oriented to Situation Alcohol / Substance use:  Not Applicable Psych involvement (Current and /or in the community):  No (Comment)  Discharge Needs  Concerns to be addressed:  No discharge needs identified Readmission within the last 30 days:  No Current discharge risk:  None Barriers to Discharge:  Continued Medical Work up   [place], LCSW 01/02/2016, 1:31 PM

## 2016-01-02 NOTE — Progress Notes (Signed)
eLink Physician-Brief Progress Note Patient Name: Abigail MiyamotoRoy L Malkiewicz DOB: 1941-07-11 MRN: [id number]   Date of Service  01/02/2016  HPI/Events of Note  Some agitation and slight increase in Temp with IVIg. Will temporarily stop it, give benadryl. Also noted blood in stool > check CBC now. Soft wrist restraints ordered  eICU Interventions       Intervention Category Intermediate Interventions: Other:  Davidlee Jeanbaptiste S. 01/02/2016, 7:48 PM

## 2016-01-03 ENCOUNTER — Inpatient Hospital Stay (HOSPITAL_COMMUNITY): Payer: Medicare Other

## 2016-01-03 ENCOUNTER — Other Ambulatory Visit: Payer: Self-pay

## 2016-01-03 DIAGNOSIS — I214 Non-ST elevation (NSTEMI) myocardial infarction: Secondary | ICD-10-CM

## 2016-01-03 DIAGNOSIS — N179 Acute kidney failure, unspecified: Secondary | ICD-10-CM | POA: Insufficient documentation

## 2016-01-03 DIAGNOSIS — I248 Other forms of acute ischemic heart disease: Secondary | ICD-10-CM | POA: Insufficient documentation

## 2016-01-03 DIAGNOSIS — J9601 Acute respiratory failure with hypoxia: Secondary | ICD-10-CM | POA: Insufficient documentation

## 2016-01-03 DIAGNOSIS — I4891 Unspecified atrial fibrillation: Secondary | ICD-10-CM

## 2016-01-03 LAB — BASIC METABOLIC PANEL
ANION GAP: 18 — AB (ref 5–15)
ANION GAP: 11 (ref 5–15)
BUN: 66 mg/dL — ABNORMAL HIGH (ref 6–20)
BUN: 70 mg/dL — ABNORMAL HIGH (ref 6–20)
CALCIUM: 8 mg/dL — AB (ref 8.9–10.3)
CHLORIDE: 106 mmol/L (ref 101–111)
CO2: 12 mmol/L — AB (ref 22–32)
CO2: 21 mmol/L — ABNORMAL LOW (ref 22–32)
Calcium: 8.1 mg/dL — ABNORMAL LOW (ref 8.9–10.3)
Chloride: 107 mmol/L (ref 101–111)
Creatinine, Ser: 2.57 mg/dL — ABNORMAL HIGH (ref 0.61–1.24)
Creatinine, Ser: 2.6 mg/dL — ABNORMAL HIGH (ref 0.61–1.24)
GFR calc Af Amer: 26 mL/min — ABNORMAL LOW (ref >60)
GFR calc non Af Amer: 23 mL/min — ABNORMAL LOW (ref >60)
GFR calc non Af Amer: 23 mL/min — ABNORMAL LOW (ref >60)
GFR, EST AFRICAN AMERICAN: 26 mL/min — AB (ref >60)
GLUCOSE: 371 mg/dL — AB (ref 65–99)
Glucose, Bld: 240 mg/dL — ABNORMAL HIGH (ref 65–99)
POTASSIUM: 5.7 mmol/L — AB (ref 3.5–5.1)
POTASSIUM: 4.8 mmol/L (ref 3.5–5.1)
SODIUM: 138 mmol/L (ref 135–145)
Sodium: 137 mmol/L (ref 135–145)

## 2016-01-03 LAB — GLUCOSE, CAPILLARY
GLUCOSE-CAPILLARY: 297 mg/dL — AB (ref 65–99)
GLUCOSE-CAPILLARY: 233 mg/dL — AB (ref 65–99)
Glucose-Capillary: 221 mg/dL — ABNORMAL HIGH (ref 65–99)
Glucose-Capillary: 239 mg/dL — ABNORMAL HIGH (ref 65–99)
Glucose-Capillary: 280 mg/dL — ABNORMAL HIGH (ref 65–99)
Glucose-Capillary: 285 mg/dL — ABNORMAL HIGH (ref 65–99)

## 2016-01-03 LAB — TYPE AND SCREEN
ABO/RH(D): O POS
ANTIBODY SCREEN: NEGATIVE
Unit division: 0

## 2016-01-03 LAB — TROPONIN I
TROPONIN I: 4.36 ng/mL — AB (ref <0.031)
Troponin I: 3.02 ng/mL (ref <0.031)

## 2016-01-03 LAB — CBC
HEMATOCRIT: 22.9 % — AB (ref 39.0–52.0)
HEMOGLOBIN: 7.5 g/dL — AB (ref 13.0–17.0)
MCH: 27 pg (ref 26.0–34.0)
MCHC: 32.8 g/dL (ref 30.0–36.0)
MCV: 82.4 fL (ref 78.0–100.0)
Platelets: 169 10*3/uL (ref 150–400)
RBC: 2.78 MIL/uL — AB (ref 4.22–5.81)
RDW: 17.5 % — ABNORMAL HIGH (ref 11.5–15.5)
WBC: 17.9 10*3/uL — AB (ref 4.0–10.5)

## 2016-01-03 LAB — POCT I-STAT 3, ART BLOOD GAS (G3+)
Acid-base deficit: 13 mmol/L — ABNORMAL HIGH (ref 0.0–2.0)
BICARBONATE: 13.2 meq/L — AB (ref 20.0–24.0)
O2 Saturation: 96 %
PH ART: 7.227 — AB (ref 7.350–7.450)
PO2 ART: 96 mmHg (ref 80.0–100.0)
Patient temperature: 98.7
TCO2: 14 mmol/L (ref 0–100)
pCO2 arterial: 31.6 mmHg — ABNORMAL LOW (ref 35.0–45.0)

## 2016-01-03 MED ORDER — SODIUM CHLORIDE 0.9% FLUSH: 10.0000 mL | INTRAVENOUS | Status: DC | PRN

## 2016-01-03 MED ORDER — SODIUM CHLORIDE 0.9 % IV SOLN
1.0000 g | Freq: Once | INTRAVENOUS | Status: AC
  Administered 2016-01-03: 1 g via INTRAVENOUS
  Filled 2016-01-03: qty 10

## 2016-01-03 MED ORDER — SODIUM CHLORIDE 0.9% FLUSH
10.0000 mL | Freq: Two times a day (BID) | INTRAVENOUS | Status: DC
  Administered 2016-01-03: 10 mL
  Administered 2016-01-03: 30 mL

## 2016-01-03 MED ORDER — FUROSEMIDE 10 MG/ML IJ SOLN
40.0000 mg | Freq: Once | INTRAMUSCULAR | Status: AC
  Administered 2016-01-03: 40 mg via INTRAVENOUS
  Filled 2016-01-03: qty 4

## 2016-01-03 MED ORDER — FUROSEMIDE 10 MG/ML IJ SOLN
80.0000 mg | Freq: Once | INTRAMUSCULAR | Status: AC
  Administered 2016-01-03: 80 mg via INTRAVENOUS
  Filled 2016-01-03: qty 8

## 2016-01-03 MED ORDER — CETYLPYRIDINIUM CHLORIDE 0.05 % MT LIQD
7.0000 mL | Freq: Two times a day (BID) | OROMUCOSAL | Status: DC
  Administered 2016-01-04 (×2): 7 mL via OROMUCOSAL

## 2016-01-03 MED ORDER — CHLORHEXIDINE GLUCONATE 0.12 % MT SOLN
15.0000 mL | Freq: Two times a day (BID) | OROMUCOSAL | Status: DC
  Administered 2016-01-03 – 2016-01-04 (×2): 15 mL via OROMUCOSAL

## 2016-01-03 MED ORDER — DEXTROSE 50 % IV SOLN: 1.0000 | Freq: Once | INTRAVENOUS | Status: DC

## 2016-01-03 MED ORDER — INSULIN ASPART 100 UNIT/ML IV SOLN
10.0000 [IU] | Freq: Once | INTRAVENOUS | Status: AC
  Administered 2016-01-03: 10 [IU] via INTRAVENOUS

## 2016-01-03 MED ORDER — DEXMEDETOMIDINE HCL IN NACL 200 MCG/50ML IV SOLN
0.2000 ug/kg/h | INTRAVENOUS | Status: DC
  Administered 2016-01-03: 0.2 ug/kg/h via INTRAVENOUS
  Administered 2016-01-03 – 2016-01-04 (×7): 0.7 ug/kg/h via INTRAVENOUS
  Filled 2016-01-03 (×2): qty 50
  Filled 2016-01-03: qty 100
  Filled 2016-01-03: qty 50
  Filled 2016-01-03: qty 100
  Filled 2016-01-03: qty 50

## 2016-01-03 MED ORDER — ASPIRIN 325 MG PO TABS
325.0000 mg | ORAL_TABLET | Freq: Every day | ORAL | Status: DC
  Administered 2016-01-03: 325 mg via ORAL
  Filled 2016-01-03 (×3): qty 1

## 2016-01-03 MED ORDER — ASPIRIN 81 MG PO CHEW
CHEWABLE_TABLET | ORAL | Status: AC
  Filled 2016-01-03: qty 1

## 2016-01-03 NOTE — Progress Notes (Signed)
eLink Physician-Brief Progress Note Patient Name: Frank Gentry DOB: [birth year] MRN: [id number]   Date of Service  01/03/2016  HPI/Events of Note  Patient currently receiving IVIG for suspected ITP.  Had some resp issues earlier.  Treated with IV benadryl and slower infusion of IVIG.  Now with grunting resp and audible wheezing.  RR is 35 with HR of 128 and BP of 131/108.  Maintaining sats.  Has had greater than 3 liters net positive over past 48 hours.  eICU Interventions  Plan: PRN albuterol neb now Stat PCXR to evaluate for edema Consider d/c of IVIG but will need to review with Heme/Onc     Intervention Category Intermediate Interventions: Respiratory distress - evaluation and management  Donetta Isaza 01/03/2016, 12:28 AM

## 2016-01-03 NOTE — Progress Notes (Signed)
Peripherally Inserted Central Catheter/Midline Placement  The IV Nurse has discussed with the patient and/or persons authorized to consent for the patient, the purpose of this procedure and the potential benefits and risks involved with this procedure.  The benefits include less needle sticks, lab draws from the catheter and patient may be discharged home with the catheter.  Risks include, but not limited to, infection, bleeding, blood clot (thrombus formation), and puncture of an artery; nerve damage and irregular heat beat.  Alternatives to this procedure were also discussed.  PICC/Midline Placement Documentation  PICC Double Lumen 01/03/16 PICC Right Cephalic 39 cm 0 cm (Active)  Indication for Insertion or Continuance of Line Vasoactive infusions;Prolonged intravenous therapies 01/03/2016  8:00 AM  Exposed Catheter (cm) 0 cm 01/03/2016  8:00 AM  Site Assessment Clean;Dry;Intact 01/03/2016  8:00 AM  Lumen #1 Status Flushed;Saline locked;Blood return noted 01/03/2016  8:00 AM  Lumen #2 Status Flushed;Saline locked;Blood return noted 01/03/2016  8:00 AM  Dressing Type Transparent 01/03/2016  8:00 AM  Dressing Status Clean;Dry;Intact;Antimicrobial disc in place 01/03/2016  8:00 AM  Line Care Connections checked and tightened 01/03/2016  8:00 AM  Line Adjustment (NICU/IV Team Only) No 01/03/2016  8:00 AM  Dressing Intervention New dressing 01/03/2016  8:00 AM  Dressing Change Due 01/10/16 01/03/2016  8:00 AM       Elliot Dallyiggs, Kenley Troop Wright 01/03/2016, 8:27 AM

## 2016-01-03 NOTE — Progress Notes (Signed)
RN called RT to patient room due to patient having increased respiratory rate and increased work of breathing.  Patient was placed on Bipap and is currently tolerating well.  Will continue to monitor.

## 2016-01-03 NOTE — Progress Notes (Signed)
Patient ID: Frank Gentry, male   DOB: 08-27-1941, 75 y.o.   MRN: [id number] [facility] Progress [facility]  Frank Gentry 75 y.o. 08-27-1941   Subjective: On Bipap. No family at bedside.  Objective: Vital signs in last 24 hours: Filed Vitals:   01/03/16 1249 01/03/16 1300  BP: 142/88 158/82  Pulse: 121 115  Temp:  98.7  Resp: 30 27    Physical Exam: Gen: somnolent, on bipap CV: Tachycardic Chest: Coarse breath sounds anteriorly Abd: distended, +BS, diffuse tenderness (with facial grimace)  Lab Results:  Recent Labs  01/14/2016 2137 01/02/16 0340 01/03/16 0133 01/03/16 0900  NA 138 136 137 138  K 6.2* 4.9 5.7* 4.8  CL 105 102 107 106  CO2 22 19* 12* 21*  GLUCOSE 354* 291* 371* 240*  BUN 62* 59* 66* 70*  CREATININE 2.39* 2.47* 2.57* 2.60*  CALCIUM 8.3* 8.3* 8.0* 8.1*  MG 2.0 1.9  --   --   PHOS 2.4* 3.0  --   --     Recent Labs  01/05/2016 0822  AST 29  ALT 22  ALKPHOS 45  BILITOT 0.5  PROT 6.5  ALBUMIN 3.3*    Recent Labs  01/02/16 2015 01/03/16 0900  WBC 29.1* 17.9*  HGB 8.4* 7.5*  HCT 26.1* 22.9*  MCV 81.8 82.4  PLT 171 169    Recent Labs  01/07/2016 0822  LABPROT 17.0*  INR 1.37      Assessment/Plan: GI bleed in the setting of ITP now on BiPap due to pulmonary edema. No active bleeding. Continue supportive care. PPI 40 mg IV Q 12 hours. When clinically stable will consider EGD prior to discharge. Please call us back when he is close to discharge or if active bleeding develops.   Frank Gentry C. 01/03/2016, 1:40 PM  Pager [phone]  If no answer or after 5 PM call [phone]

## 2016-01-03 NOTE — Progress Notes (Addendum)
PULMONARY / CRITICAL CARE MEDICINE   Name: Frank Gentry MRN: [id number] DOB: 1941-08-10    ADMISSION DATE:  01/27/2016  CONSULTATION DATE: 01/17/2016  REFERRING MD: Dr. 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    SUBJECTIVE: Dark colored stools last night. Went into resp distress last night as he was receiving IVIG. Went into pulm edema. IVIG stopped. Placed on BiPaP. Comfortable now.    VITAL SIGNS: BP 121/93 mmHg  Pulse 116  Temp(Src) 98.6 F (37 C) (Axillary)  Resp 39  Wt 193 lb 12.6 oz (87.9 kg)  SpO2 97%  HEMODYNAMICS:    VENTILATOR SETTINGS: Vent Mode:  [-] PCV;BIPAP FiO2 (%):  [40 %] 40 % Set Rate:  [15 bmp] 15 bmp PEEP:  [5 cmH20] 5 cmH20  INTAKE / OUTPUT: I/O last 3 completed shifts: In: 4874.4 [I.V.:4015.4; Blood:749; IV Piggyback:110] Out: 2983 [Urine:2983]  PHYSICAL EXAMINATION: General: Elderly male in NAD confused. Denies cp.  Neuro: Awake, alert, moves right side spontaneously, Left hemiparesis  HEENT: MM pink/moist, no jvd Cardiovascular: s1s2 irregular, SR with PAC's on monitor Lungs: Even/non-labored, faint crackles RLL  Abdomen: Obese/soft, bsx4 active  Musculoskeletal: No acute deformities  Skin: Pale, warm, dry, scattered petechiae noted  LABS:  BMET  Recent Labs Lab 01/02/2016 2137 01/02/16 0340 01/03/16 0133  NA 138 136 137  K 6.2* 4.9 5.7*  CL 105 102 107  CO2 22 19* 12*  BUN 62* 59* 66*  CREATININE 2.39* 2.47* 2.57*  GLUCOSE 354* 291* 371*    Electrolytes  Recent Labs Lab 01/19/2016 2137 01/02/16 0340 01/03/16 0133  CALCIUM 8.3* 8.3* 8.0*  MG 2.0 1.9  --   PHOS 2.4* 3.0  --     CBC  Recent Labs Lab 01/02/16 0340 01/02/16 0950 01/02/16 2015  WBC 15.3* 11.6* 29.1*  HGB 6.6* 7.2* 8.4*  HCT 20.4* 21.1* 26.1*  PLT 107* 84* 171    Coag's  Recent Labs Lab  01/02/2016 0822  APTT 29  INR 1.37    Sepsis Markers No results for input(s): LATICACIDVEN, PROCALCITON, O2SATVEN in the last 168 hours.  ABG  Recent Labs Lab 01/03/16 0210  PHART 7.227*  PCO2ART 31.6*  PO2ART 96.0    Liver Enzymes  Recent Labs Lab 01/21/2016 0822  AST 29  ALT 22  ALKPHOS 45  BILITOT 0.5  ALBUMIN 3.3*    Cardiac Enzymes  Recent Labs Lab 01/02/16 0950 01/03/16 0133 01/03/16 0536  TROPONINI 0.39* 1.33* 3.02*    Glucose  Recent Labs Lab 01/02/16 0753 01/02/16 1143 01/02/16 1527 01/02/16 2027 01/02/16 2340 01/03/16 0335  GLUCAP 305* 271* 208* 270* 280* 285*    Imaging Dg Chest Port 1 View  01/03/2016  CLINICAL DATA:  75 year old male with respiratory distress EXAM: PORTABLE CHEST 1 VIEW COMPARISON:  Radiograph dated 01/02/2016 FINDINGS: Single portable view of the chest demonstrate mild cardiomegaly. There is diffuse interstitial nodular prominence, new or worsened since the prior study, and likely representing interstitial edema versus less likely congestive changes. An area of slightly increased density at the left lung base noted. Superimposed pneumonia is not excluded. Trace left pleural effusion may be present. There is apparent narrowing of the transverse diameter of the trachea suggestive of underlying chronic obstructive lung disease. No acute osseous pathology identified. IMPRESSION: Mild cardiomegaly with findings of interstitial edema versus less likely congestive changes. Superimposed pneumonia is not  excluded. Clinical correlation and follow-up is recommended. Electronically Signed   By: Elgie CollardArash  Radparvar M.D.   On: 01/03/2016 01:24   STUDIES:  CT head 3/3> cancelled   CULTURES: BCx2 3/2 >> P UA 3/2 >>   ANTIBIOTICS: Not on any  SIGNIFICANT EVENTS: [facility], started on IVIg 3/03  Went into distress with IVIG; troponin elevated.   LINES/TUBES:   DISCUSSION: 75 y/o M admitted with acute onset GIB,  ABL and profound thrombocytopenia likely due to ITP, dramatic improvement after IVIg.  ASSESSMENT / PLAN:  PULMONARY A: Hx COPD, Former Tobacco Abuse  Pulm edema related to IVIG P:  Oxygen as needed for saturations >92% PRN albuterol for wheezing  S/P Lasix.  BiPaP prn.   CARDIOVASCULAR A:  Hx HTN, HLD PAC's QTc prolonged ACS; likely demand ischemia.   P:  Cycle troponin Tele monitoring Consider cardiology if it continues to elevate. Son aware.   RENAL A:  Acute Kidney Injury - in setting of ABL  P:  Trend BMP / UOP  Replace electrolytes as indicated  KVO  GASTROINTESTINAL A:  GIB - acute hematemesis > resolved P:  Clears today Change Protonix to bid Stop Octreotide gtt for now   HEMATOLOGIC A:  Anemia  Thrombocytopenia - ITP P:  IVIg per hematology -- will hold off on IVIG for now. plt is better.  Continue decadron Transfuse PRB for Hgb < 7 gm/dL  INFECTIOUS A:  No acute issues P:  Assess cultures as above Hold abx for now   ENDOCRINE A:  DM II Hypothyroidism  Hyperglycemia on steroids P:  Monitor CBG / glucose on BMP  Q4 CBG SSI increase to resistant scale Continue synthroid   NEUROLOGIC A:  Hx CVA - on plavix at baseline  Depression / Anxiety  Acute encephalopathy overnight 3/3 > non-focal exam today, polypharmacy baseline with psychoactive meds P:  RASS goal: 0 Hold Precedex Stop lorazepam Restart home effexor Hold home plavix  Restart effexor, continue to hold trazodone, lyrica, clexa CT head if no improvement today after holding precedex   FAMILY  - Updates: Son Clide CliffRicky updated via phone. Mentioned to him re: pulm edema and ACS. He is OK with calling Cards if troponin continues to rise.   - Inter-disciplinary family meet or Palliative Care meeting due by: 01/08/16  Hold off on [place] transfer. Pt is DNR.   Critical Care time spent today : 30 minutes.    Pollie MeyerJ. Angelo A de Dios,  MD Pulmonary and Critical Care Medicine [facility] Pager: [phone] After 3 pm or if no response, call [phone]  01/03/2016, 7:20 AM    Addendum 3/4 10:40 am. Troponin higher at 4. Pt is comfortable. Spoke with son -- updated. Consulted Cards -- Dr. Royann Shiversroitoru. Cards will see pt. Will order echo.

## 2016-01-03 NOTE — Progress Notes (Signed)
Patient is currently resting comfortably on nasal cannula.  Patient is in no distress at this time.  Bipap not needed at this time. Will continue to monitor.

## 2016-01-03 NOTE — Progress Notes (Signed)
Patient's work of breathing is increasing significantly. RR 41 with accessory muscle use. HR 110's to 120's. In and out of afib with a current BP of 158/82. Dr. Christene Slatese Dios ordered to place patient back on BiPap. Patient is very restless despite precedex gtt. PICC line and PIV pulled out yesterday due to restlessness. It is in the patient's best interest to be kept in bilateral wrist restraints despite being on the bipap machine. He continues to remove mask with a high risk of going into respiratory arrest. Please note patient is a DNR status and we will not be able to escalate care in the event of respiratory arrest. Dr. Christene Slatese Dios agrees with the above information. He has ordered patient to stay in bilateral wrist restraints while on the bipap machine for now until respiratory status is more stable. RN will continue to monitor patient closely including vital signs and physical appearance. All bipap/vent alarms with be addressed immediately by respiratory therapist and RN.

## 2016-01-03 NOTE — Progress Notes (Signed)
Pt not tolerating BiPAP, pulling bipap and leads off. Pt trying to get out of bed. Bipap holiday given, pt placed on 2L [place]. This RN will continue to monitor pt closely.

## 2016-01-03 NOTE — Consult Note (Signed)
Cardiologist: New Allergies: No known drug allergies Reason for Consult: N STEMI Referring Physician: Corrie Dandy  Frank Gentry is an 75 y.o. male.  HPI:   Patient is a 75 year old male with history of hypertension, hyperlipidemia, diabetes mellitus, CVA, COPD, BPH, depression, anxiety and hypothyroidism. 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  His troponin is up to 4.36 and is a serum creatinine of 2.60. Initial EKG showed right bundle branch block. PACs, inferior Q waves.  An EKG shows A. fib with rapid ventricular response.  Hemoglobin today is 7.5 but was as low as 6.6.       Past Medical History  Diagnosis Date  . Essential hypertension   . Hyperlipidemia   . Type II diabetes with long term use of insulin ([place])   . History of CVA (cerebrovascular accident)   . COPD (chronic obstructive pulmonary disease) ([place])   . BPH (benign prostatic hyperplasia)   . Depression   . Anxiety   . Hypothyroidism     No past surgical history on file.  Family History  Problem Relation Age of Onset  . CVA Brother     Social History:  reports that he has quit smoking. His smoking use included Cigarettes. He has a 30 pack-year smoking history. He does not have any smokeless tobacco history on file. He reports that he does not drink alcohol or use illicit drugs.  Allergies: No Known Allergies  Medications:  Scheduled Meds: . amLODipine  2.5 mg Oral Daily  . aspirin  325 mg Oral Daily  . cloNIDine  0.1 mg Oral QHS  . dexamethasone  40 mg Intravenous Q24H  . dextrose  1 ampule Intravenous Once  . dextrose  1 ampule Intravenous Once  . insulin aspart  0-20 Units Subcutaneous 6 times per day  . levothyroxine  25 mcg Oral QAC breakfast  . pantoprazole (PROTONIX) IV  40 mg Intravenous Q12H  . sodium chloride flush  10-40 mL Intracatheter Q12H  . sodium chloride flush  10-40 mL Intracatheter Q12H  . venlafaxine  37.5 mg Oral BID WC    Continuous Infusions: . sodium chloride 10 mL/hr at 01/03/16 0700  . dexmedetomidine 0.7 mcg/kg/hr (01/03/16 1627)   PRN Meds:.sodium chloride, albuterol, hydrALAZINE, ondansetron (ZOFRAN) IV, sodium chloride flush, sodium chloride flush   Results for orders placed or performed during the hospital encounter of 01/13/2016 (from the past 48 hour(s))  Culture, blood (Routine X 2) w Reflex to ID Panel     Status: None (Preliminary result)   Collection Time: 01/13/2016  5:10 PM  Result Value Ref Range   Specimen Description BLOOD RIGHT ARM    Special Requests BOTTLES DRAWN AEROBIC ONLY 10CC    Culture NO GROWTH 2 DAYS    Report Status PENDING   Culture, blood (Routine X 2) w Reflex to ID Panel     Status: None (Preliminary result)   Collection Time: 01/09/2016  5:17 PM  Result Value Ref Range   Specimen Description BLOOD RIGHT HAND    Special Requests BOTTLES DRAWN AEROBIC ONLY 5CC    Culture NO GROWTH 2 DAYS    Report Status PENDING   Type and screen [place]     Status: None   Collection Time: 01/20/2016  5:21 PM  Result Value Ref Range   ABO/RH(D) O POS    Antibody Screen NEG    Sample Expiration Jan 10, 2016    Unit Number [id number]  Blood Component Type RBC LR PHER2    Unit division 00    Status of Unit ISSUED,FINAL    Transfusion Status OK TO TRANSFUSE    Crossmatch Result Compatible   ABO/Rh     Status: None   Collection Time: 12/31/2015  5:21 PM  Result Value Ref Range   ABO/RH(D) O POS   Prepare Pheresed Platelets     Status: None   Collection Time: 01/28/2016  5:22 PM  Result Value Ref Range   Unit Number [id number]    Blood Component Type PLTPHER LR1    Unit division 00    Status of Unit ISSUED,FINAL    Transfusion Status OK TO TRANSFUSE   Urinalysis, Routine w reflex microscopic (not at [facility])     Status: Abnormal   Collection Time: 12/31/2015  5:24 PM  Result Value Ref Range   Color, Urine YELLOW YELLOW   APPearance CLEAR CLEAR   Specific  Gravity, Urine 1.019 1.005 - 1.030   pH 5.0 5.0 - 8.0   Glucose, UA 100 (A) NEGATIVE mg/dL   Hgb urine dipstick LARGE (A) NEGATIVE   Bilirubin Urine SMALL (A) NEGATIVE   Ketones, ur 15 (A) NEGATIVE mg/dL   Protein, ur 100 (A) NEGATIVE mg/dL   Nitrite NEGATIVE NEGATIVE   Leukocytes, UA NEGATIVE NEGATIVE  Urine microscopic-add on     Status: Abnormal   Collection Time: 01/29/2016  5:24 PM  Result Value Ref Range   Squamous Epithelial / LPF 0-5 (A) NONE SEEN   WBC, UA 0-5 0 - 5 WBC/hpf   RBC / HPF 6-30 0 - 5 RBC/hpf   Bacteria, UA FEW (A) NONE SEEN   Urine-Other LESS THAN 10 mL OF URINE SUBMITTED   TSH     Status: None   Collection Time: 01/28/2016  5:32 PM  Result Value Ref Range   TSH 1.660 0.350 - 4.500 uIU/mL  Troponin I     Status: Abnormal   Collection Time: 12/31/2015  5:33 PM  Result Value Ref Range   Troponin I 0.05 (H) <0.031 ng/mL    Comment:        PERSISTENTLY INCREASED TROPONIN VALUES IN THE RANGE OF 0.04-0.49 ng/mL CAN BE SEEN IN:       -UNSTABLE ANGINA       -CONGESTIVE HEART FAILURE       -MYOCARDITIS       -CHEST TRAUMA       -ARRYHTHMIAS       -LATE PRESENTING MYOCARDIAL INFARCTION       -COPD   CLINICAL FOLLOW-UP RECOMMENDED.   CBC     Status: Abnormal   Collection Time: 12/31/2015  5:33 PM  Result Value Ref Range   WBC 12.6 (H) 4.0 - 10.5 K/uL   RBC 4.02 (L) 4.22 - 5.81 MIL/uL   Hemoglobin 10.3 (L) 13.0 - 17.0 g/dL   HCT 31.8 (L) 39.0 - 52.0 %   MCV 79.1 78.0 - 100.0 fL   MCH 25.6 (L) 26.0 - 34.0 pg   MCHC 32.4 30.0 - 36.0 g/dL   RDW 16.3 (H) 11.5 - 15.5 %   Platelets <5 (LL) 150 - 400 K/uL    Comment: PLATELET COUNT CONFIRMED BY SMEAR REPEATED TO VERIFY CRITICAL RESULT CALLED TO, READ BACK BY AND VERIFIED WITH: Ferrel Logan RN  [phone] [id number] GREEN R   Save smear     Status: None   Collection Time: 01/17/2016  5:33 PM  Result Value Ref Range   Smear Review SMEAR  STAINED AND AVAILABLE FOR REVIEW   Haptoglobin     Status: None   Collection Time: 01/02/2016   5:33 PM  Result Value Ref Range   Haptoglobin 106 34 - 200 mg/dL    Comment: (NOTE) Performed At: [facility] [place], [place] [id number] Lindon Romp MD DP:[id number]   Pathologist smear review     Status: None   Collection Time: 01/28/2016  5:33 PM  Result Value Ref Range   Path Review MARKED THROMBOCYOPENIA.     Comment: Reviewed by Chrystie Nose. Saralyn Pilar, M.D. 01/02/16.   Glucose, capillary     Status: Abnormal   Collection Time: 01/18/2016  8:23 PM  Result Value Ref Range   Glucose-Capillary 287 (H) 65 - 99 mg/dL  Basic metabolic panel     Status: Abnormal   Collection Time: 01/07/2016  9:37 PM  Result Value Ref Range   Sodium 138 135 - 145 mmol/L   Potassium 6.2 (HH) 3.5 - 5.1 mmol/L    Comment: NO VISIBLE HEMOLYSIS CRITICAL RESULT CALLED TO, READ BACK BY AND VERIFIED WITH: THOMPSON A,RN 01/16/2016 2221 WAYK    Chloride 105 101 - 111 mmol/L   CO2 22 22 - 32 mmol/L   Glucose, Bld 354 (H) 65 - 99 mg/dL   BUN 62 (H) 6 - 20 mg/dL   Creatinine, Ser 2.39 (H) 0.61 - 1.24 mg/dL   Calcium 8.3 (L) 8.9 - 10.3 mg/dL   GFR calc non Af Amer 25 (L) >60 mL/min   GFR calc Af Amer 29 (L) >60 mL/min    Comment: (NOTE) The eGFR has been calculated using the CKD EPI equation. This calculation has not been validated in all clinical situations. eGFR's persistently <60 mL/min signify possible Chronic Kidney Disease.    Anion gap 11 5 - 15  Magnesium     Status: None   Collection Time: 01/12/2016  9:37 PM  Result Value Ref Range   Magnesium 2.0 1.7 - 2.4 mg/dL  Phosphorus     Status: Abnormal   Collection Time: 01/16/2016  9:37 PM  Result Value Ref Range   Phosphorus 2.4 (L) 2.5 - 4.6 mg/dL  Glucose, capillary     Status: Abnormal   Collection Time: 01/08/2016 11:07 PM  Result Value Ref Range   Glucose-Capillary 327 (H) 65 - 99 mg/dL  Glucose, capillary     Status: Abnormal   Collection Time: 01/02/16 12:12 AM  Result Value Ref Range   Glucose-Capillary 262 (H) 65 - 99  mg/dL  Glucose, capillary     Status: Abnormal   Collection Time: 01/02/16  3:16 AM  Result Value Ref Range   Glucose-Capillary 277 (H) 65 - 99 mg/dL  CBC     Status: Abnormal   Collection Time: 01/02/16  3:40 AM  Result Value Ref Range   WBC 15.3 (H) 4.0 - 10.5 K/uL   RBC 2.58 (L) 4.22 - 5.81 MIL/uL   Hemoglobin 6.6 (LL) 13.0 - 17.0 g/dL    Comment: REPEATED TO VERIFY CRITICAL RESULT CALLED TO, READ BACK BY AND VERIFIED WITH: A.[facility] 4315 01/02/16 M.CAMPBELL    HCT 20.4 (L) 39.0 - 52.0 %   MCV 79.1 78.0 - 100.0 fL   MCH 25.6 (L) 26.0 - 34.0 pg   MCHC 32.4 30.0 - 36.0 g/dL   RDW 16.5 (H) 11.5 - 15.5 %   Platelets 107 (L) 150 - 400 K/uL    Comment: POST TRANSFUSION SPECIMEN PLATELET COUNT CONFIRMED BY SMEAR   Basic  metabolic panel     Status: Abnormal   Collection Time: 01/02/16  3:40 AM  Result Value Ref Range   Sodium 136 135 - 145 mmol/L   Potassium 4.9 3.5 - 5.1 mmol/L    Comment: DELTA CHECK NOTED   Chloride 102 101 - 111 mmol/L   CO2 19 (L) 22 - 32 mmol/L   Glucose, Bld 291 (H) 65 - 99 mg/dL   BUN 59 (H) 6 - 20 mg/dL   Creatinine, Ser 2.47 (H) 0.61 - 1.24 mg/dL   Calcium 8.3 (L) 8.9 - 10.3 mg/dL   GFR calc non Af Amer 24 (L) >60 mL/min   GFR calc Af Amer 28 (L) >60 mL/min    Comment: (NOTE) The eGFR has been calculated using the CKD EPI equation. This calculation has not been validated in all clinical situations. eGFR's persistently <60 mL/min signify possible Chronic Kidney Disease.    Anion gap 15 5 - 15  Magnesium     Status: None   Collection Time: 01/02/16  3:40 AM  Result Value Ref Range   Magnesium 1.9 1.7 - 2.4 mg/dL  Phosphorus     Status: None   Collection Time: 01/02/16  3:40 AM  Result Value Ref Range   Phosphorus 3.0 2.5 - 4.6 mg/dL  Troponin I     Status: Abnormal   Collection Time: 01/02/16  3:46 AM  Result Value Ref Range   Troponin I 0.06 (H) <0.031 ng/mL    Comment:        PERSISTENTLY INCREASED TROPONIN VALUES IN THE RANGE OF  0.04-0.49 ng/mL CAN BE SEEN IN:       -UNSTABLE ANGINA       -CONGESTIVE HEART FAILURE       -MYOCARDITIS       -CHEST TRAUMA       -ARRYHTHMIAS       -LATE PRESENTING MYOCARDIAL INFARCTION       -COPD   CLINICAL FOLLOW-UP RECOMMENDED.   Prepare RBC     Status: None   Collection Time: 01/02/16  4:07 AM  Result Value Ref Range   Order Confirmation ORDER PROCESSED BY BLOOD BANK   Glucose, capillary     Status: Abnormal   Collection Time: 01/02/16  7:53 AM  Result Value Ref Range   Glucose-Capillary 305 (H) 65 - 99 mg/dL  Troponin I     Status: Abnormal   Collection Time: 01/02/16  9:50 AM  Result Value Ref Range   Troponin I 0.39 (H) <0.031 ng/mL    Comment:        PERSISTENTLY INCREASED TROPONIN VALUES IN THE RANGE OF 0.04-0.49 ng/mL CAN BE SEEN IN:       -UNSTABLE ANGINA       -CONGESTIVE HEART FAILURE       -MYOCARDITIS       -CHEST TRAUMA       -ARRYHTHMIAS       -LATE PRESENTING MYOCARDIAL INFARCTION       -COPD   CLINICAL FOLLOW-UP RECOMMENDED.   CBC     Status: Abnormal   Collection Time: 01/02/16  9:50 AM  Result Value Ref Range   WBC 11.6 (H) 4.0 - 10.5 K/uL   RBC 2.64 (L) 4.22 - 5.81 MIL/uL   Hemoglobin 7.2 (L) 13.0 - 17.0 g/dL   HCT 21.1 (L) 39.0 - 52.0 %   MCV 79.9 78.0 - 100.0 fL   MCH 27.3 26.0 - 34.0 pg   MCHC 34.1 30.0 - 36.0 g/dL  RDW 16.6 (H) 11.5 - 15.5 %   Platelets 84 (L) 150 - 400 K/uL    Comment: CONSISTENT WITH PREVIOUS RESULT  Glucose, capillary     Status: Abnormal   Collection Time: 01/02/16 11:43 AM  Result Value Ref Range   Glucose-Capillary 271 (H) 65 - 99 mg/dL  Glucose, capillary     Status: Abnormal   Collection Time: 01/02/16  3:27 PM  Result Value Ref Range   Glucose-Capillary 208 (H) 65 - 99 mg/dL  CBC     Status: Abnormal   Collection Time: 01/02/16  8:15 PM  Result Value Ref Range   WBC 29.1 (H) 4.0 - 10.5 K/uL   RBC 3.19 (L) 4.22 - 5.81 MIL/uL   Hemoglobin 8.4 (L) 13.0 - 17.0 g/dL   HCT 26.1 (L) 39.0 - 52.0 %   MCV  81.8 78.0 - 100.0 fL   MCH 26.3 26.0 - 34.0 pg   MCHC 32.2 30.0 - 36.0 g/dL   RDW 17.0 (H) 11.5 - 15.5 %   Platelets 171 150 - 400 K/uL    Comment: DELTA CHECK NOTED POST TRANSFUSION SPECIMEN   Glucose, capillary     Status: Abnormal   Collection Time: 01/02/16  8:27 PM  Result Value Ref Range   Glucose-Capillary 270 (H) 65 - 99 mg/dL  Glucose, capillary     Status: Abnormal   Collection Time: 01/02/16 11:40 PM  Result Value Ref Range   Glucose-Capillary 280 (H) 65 - 99 mg/dL  Basic metabolic panel     Status: Abnormal   Collection Time: 01/03/16  1:33 AM  Result Value Ref Range   Sodium 137 135 - 145 mmol/L   Potassium 5.7 (H) 3.5 - 5.1 mmol/L   Chloride 107 101 - 111 mmol/L   CO2 12 (L) 22 - 32 mmol/L   Glucose, Bld 371 (H) 65 - 99 mg/dL   BUN 66 (H) 6 - 20 mg/dL   Creatinine, Ser 2.57 (H) 0.61 - 1.24 mg/dL   Calcium 8.0 (L) 8.9 - 10.3 mg/dL   GFR calc non Af Amer 23 (L) >60 mL/min   GFR calc Af Amer 26 (L) >60 mL/min    Comment: (NOTE) The eGFR has been calculated using the CKD EPI equation. This calculation has not been validated in all clinical situations. eGFR's persistently <60 mL/min signify possible Chronic Kidney Disease.    Anion gap 18 (H) 5 - 15  Troponin I (q 6hr x 3)     Status: Abnormal   Collection Time: 01/03/16  1:33 AM  Result Value Ref Range   Troponin I 1.33 (HH) <0.031 ng/mL    Comment:        POSSIBLE MYOCARDIAL ISCHEMIA. SERIAL TESTING RECOMMENDED. CRITICAL RESULT CALLED TO, READ BACK BY AND VERIFIED WITH: A.[facility] 01/03/16 M.CAMPBELL   I-STAT 3, arterial blood gas (G3+)     Status: Abnormal   Collection Time: 01/03/16  2:10 AM  Result Value Ref Range   pH, Arterial 7.227 (L) 7.350 - 7.450   pCO2 arterial 31.6 (L) 35.0 - 45.0 mmHg   pO2, Arterial 96.0 80.0 - 100.0 mmHg   Bicarbonate 13.2 (L) 20.0 - 24.0 mEq/L   TCO2 14 0 - 100 mmol/L   O2 Saturation 96.0 %   Acid-base deficit 13.0 (H) 0.0 - 2.0 mmol/L   Patient temperature 98.7 F     Sample type ARTERIAL   Glucose, capillary     Status: Abnormal   Collection Time: 01/03/16  3:35 AM  Result Value Ref Range   Glucose-Capillary 285 (H) 65 - 99 mg/dL  Troponin I (q 6hr x 3)     Status: Abnormal   Collection Time: 01/03/16  5:36 AM  Result Value Ref Range   Troponin I 3.02 (HH) <0.031 ng/mL    Comment:        POSSIBLE MYOCARDIAL ISCHEMIA. SERIAL TESTING RECOMMENDED. CRITICAL VALUE NOTED.  VALUE IS CONSISTENT WITH PREVIOUSLY REPORTED AND CALLED VALUE.   Glucose, capillary     Status: Abnormal   Collection Time: 01/03/16  8:37 AM  Result Value Ref Range   Glucose-Capillary 221 (H) 65 - 99 mg/dL   Comment 1 Notify RN    Comment 2 Document in Chart   Troponin I (q 6hr x 3)     Status: Abnormal   Collection Time: 01/03/16  9:00 AM  Result Value Ref Range   Troponin I 4.36 (HH) <0.031 ng/mL    Comment:        POSSIBLE MYOCARDIAL ISCHEMIA. SERIAL TESTING RECOMMENDED. CRITICAL VALUE NOTED.  VALUE IS CONSISTENT WITH PREVIOUSLY REPORTED AND CALLED VALUE.   Basic metabolic panel     Status: Abnormal   Collection Time: 01/03/16  9:00 AM  Result Value Ref Range   Sodium 138 135 - 145 mmol/L   Potassium 4.8 3.5 - 5.1 mmol/L   Chloride 106 101 - 111 mmol/L   CO2 21 (L) 22 - 32 mmol/L   Glucose, Bld 240 (H) 65 - 99 mg/dL   BUN 70 (H) 6 - 20 mg/dL   Creatinine, Ser 2.60 (H) 0.61 - 1.24 mg/dL   Calcium 8.1 (L) 8.9 - 10.3 mg/dL   GFR calc non Af Amer 23 (L) >60 mL/min   GFR calc Af Amer 26 (L) >60 mL/min    Comment: (NOTE) The eGFR has been calculated using the CKD EPI equation. This calculation has not been validated in all clinical situations. eGFR's persistently <60 mL/min signify possible Chronic Kidney Disease.    Anion gap 11 5 - 15  CBC     Status: Abnormal   Collection Time: 01/03/16  9:00 AM  Result Value Ref Range   WBC 17.9 (H) 4.0 - 10.5 K/uL   RBC 2.78 (L) 4.22 - 5.81 MIL/uL   Hemoglobin 7.5 (L) 13.0 - 17.0 g/dL   HCT 22.9 (L) 39.0 - 52.0 %   MCV  82.4 78.0 - 100.0 fL   MCH 27.0 26.0 - 34.0 pg   MCHC 32.8 30.0 - 36.0 g/dL   RDW 17.5 (H) 11.5 - 15.5 %   Platelets 169 150 - 400 K/uL  Glucose, capillary     Status: Abnormal   Collection Time: 01/03/16 12:09 PM  Result Value Ref Range   Glucose-Capillary 239 (H) 65 - 99 mg/dL  Glucose, capillary     Status: Abnormal   Collection Time: 01/03/16  4:21 PM  Result Value Ref Range   Glucose-Capillary 297 (H) 65 - 99 mg/dL    Dg Chest Port 1 View  01/03/2016  CLINICAL DATA:  75 year old male with respiratory distress EXAM: PORTABLE CHEST 1 VIEW COMPARISON:  Radiograph dated 01/02/2016 FINDINGS: Single portable view of the chest demonstrate mild cardiomegaly. There is diffuse interstitial nodular prominence, new or worsened since the prior study, and likely representing interstitial edema versus less likely congestive changes. An area of slightly increased density at the left lung base noted. Superimposed pneumonia is not excluded. Trace left pleural effusion may be present. There is apparent narrowing of the transverse  diameter of the trachea suggestive of underlying chronic obstructive lung disease. No acute osseous pathology identified. IMPRESSION: Mild cardiomegaly with findings of interstitial edema versus less likely congestive changes. Superimposed pneumonia is not excluded. Clinical correlation and follow-up is recommended. Electronically Signed   By: Anner Crete M.D.   On: 01/03/2016 01:24   Dg Chest Port 1 View  01/02/2016  CLINICAL DATA:  Pulmonary edema. EXAM: PORTABLE CHEST 1 VIEW COMPARISON:  01/07/2016. FINDINGS: Right PICC line in stable position. Mediastinum and hilar structures are normal . Cardiomegaly with pulmonary vascular prominence. Mild basilar atelectasis and/or infiltrates. Calcified nodule left upper lobe consistent granuloma . No pleural effusion or pneumothorax . IMPRESSION: 1. Right PICC line in stable position. 2. Bibasilar atelectasis and/or infiltrates. 3. Stable  cardiomegaly. Electronically Signed   By: Marcello Moores  Register   On: 01/02/2016 07:31   Dg Chest Port 1 View  01/07/2016  CLINICAL DATA:  Status post PICC line placement EXAM: PORTABLE CHEST 1 VIEW COMPARISON:  Film from earlier in the same day FINDINGS: Cardiac shadow is stable. The lungs are again well aerated. Slight increased density is noted in the medial aspect of the right apex. This is better visualized than on the prior exam. Two-view chest may be helpful for further evaluation. Calcified granuloma is again seen. New right-sided PICC line is noted with the catheter tip at the cavoatrial junction in satisfactory position. No bony abnormality is seen. IMPRESSION: Status post PICC line placement as described. Questionable increased density in the medial aspect of the right apex. PA and lateral chest when the patient's condition improves may be helpful. Electronically Signed   By: Inez Catalina M.D.   On: 01/21/2016 21:54    Review of Systems  Unable to perform ROS: acuity of condition   Blood pressure 148/68, pulse 111, temperature 98.9 F (37.2 C), temperature source Oral, resp. rate 23, weight 193 lb 12.6 oz (87.9 kg), SpO2 100 %. Physical Exam  Nursing note and vitals reviewed. Constitutional: He appears well-developed and well-nourished.  Sedated on BIPAP.  Arousable.   HENT:  Head: Normocephalic.  Eyes: EOM are normal. Pupils are equal, round, and reactive to light. No scleral icterus.  Neck: Normal range of motion.  Cardiovascular: Normal rate, regular rhythm, S1 normal and S2 normal.  Exam reveals distant heart sounds.   No murmur heard. Pulses:      Radial pulses are 2+ on the right side, and 2+ on the left side.       Dorsalis pedis pulses are 1+ on the right side, and 1+ on the left side.  Respiratory: He has rales.  + Rhonchi    GI: Bowel sounds are normal. He exhibits distension. There is no tenderness.  Tense  Neurological: He is alert.  arousable but sedated.   Skin:  Skin is warm and dry.    Assessment/Plan: Active Problems:   GIB (gastrointestinal bleeding)   Acute respiratory failure with hypoxemia (HCC)   Demand ischemia (HCC)   AKI (acute kidney injury) ([place])   Atrial fibrillation with RVR (HCC)   NSTEMI (non-ST elevated myocardial infarction) (HCC)   Acute Pulmonary edema  75 year old male with history of tobacco abuse, hypertension, hyperlipidemia, diabetes mellitus, CVA, COPD, BPH, depression, anxiety and hypothyroidism. 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  His troponin is up to 4.36 and is a serum creatinine of 2.60. Initial EKG showed right bundle branch block. PACs, inferior Q waves.  An EKG shows  A. fib with rapid ventricular response.  Hemoglobin today is 7.5 but was as low as 6.6.  He had an allergic reaction to IVIG and developed pulmonary edema.  He had 20m of IV lasix at 0100 and 48mat 1256hrs.  UOP since 0700 is 52542m    Troponin up to 4.36.  He likely has some underlying CAD given his history.  Apparently he has not complained of CP.  Probably demand ischemia from marked anemia and afib RVR. HR currently 90's while asleep.  It increases during agitation.  If HR is sustained above 100, use IV lopressor 5mg39mWe need to review his echocardiogram once completed.   Medical Mgt for now.  Not a candidate for invasive ischemic eval.  Continue with IV lasix.  K and mag WNL.   HAGETarri FullerC [place]/2017, 5:02 PM   I have seen and examined the patient along with HAGER, BRYAN, PAC.  I have reviewed the chart, notes and new data.  I agree with PA's note.  Elderly gentleman with multiple serious medical conditions with evidence of a non-ST segment elevation myocardial infarction and new onset atrial fibrillation leading to acute heart failure exacerbation/pulmonary edema. This is in the setting of moderate to severe anemia related to recent gastrointestinal bleeding and thrombocytopenia on a  background of diabetes, previous stroke, dementia, COPD and tobacco abuse, now with acute renal insufficiency as well.  PLAN: Unfortunately, the simultaneous occurrence of multiple serious medical problems makes management of the cardiac issues challenging and very limited. Recommend data blockers for ventricular rate control area unable to treat with antiplatelet or anticoagulant medications due to recent severe bleeding, anemia and thrombocytopenia. Invasive evaluation with angiography is also out of the question due to acute renal failure. His overall chronic functional status is poor and I doubt he will ever be a candidate for invasive angiography. High risk for thromboembolic complications in this elderly patient with atrial fibrillation and previous stroke who cannot be treated with anticoagulants. Overall prognosis is poor.    MihaSanda Gentry, [id number] 430 [id number], 5:35 PM

## 2016-01-03 NOTE — Progress Notes (Signed)
UR Completed. Katherin Ramey, RN, BSN.  [phone] 

## 2016-01-03 NOTE — Progress Notes (Signed)
IVIG stopped per Dr. Darrick Pennaeterding. Pt is tachypnic in the 40's and tachycardic in the 130's. Breathing is labored and lungs sounds include wheezes and crackles throughout. RN will continue to monitor pt closely.

## 2016-01-03 NOTE — Progress Notes (Signed)
Updated Deterding MD regarding troponin of 3.02. Will continue to monitor. Melina Schoolshompson, Mckensi Redinger E, [place]RN 01/03/2016 6:46 AM

## 2016-01-03 NOTE — Progress Notes (Addendum)
CRITICAL VALUE ALERT  Critical value received:  Troponin 1.33  Date of notification:  01/02/2016  Time of notification:  0203  Critical value read back:Yes.    Nurse who received alert:  April Thompson  MD notified (1st page):  Yes  Time of first page:  0204  MD notified (2nd page):  Time of second page:  Responding MD:  Eugenia Pancoastrimble  Time MD responded:  [phone]

## 2016-01-03 NOTE — Progress Notes (Signed)
eLink Physician-Brief Progress Note Patient Name: Frank MiyamotoRoy L Kindley DOB: May 22, 1941 MRN: [id number]   Date of Service  01/03/2016  HPI/Events of Note  PCXR shows diffuse edema.  Also change in rhythm pattern on monitor to wider complex.  Had issues with hyperkalemia on 3/2 labs with some renal insufficiency.  eICU Interventions  Plan: Lasix 80 mg IV now BiPAP as needed for resp support Check stat BMET for K level Cycle trop.     Intervention Category Intermediate Interventions: Arrhythmia - evaluation and management;Respiratory distress - evaluation and management  Axelle Szwed 01/03/2016, 1:03 AM

## 2016-01-03 NOTE — Progress Notes (Signed)
Reduced IVIG rate. get portable CXR, and albuterol treatment ordered per Deterding MD. Janee Mornhompson, Cesareo Vickrey E, RN 01/03/2016 12:29 AM

## 2016-01-03 NOTE — Progress Notes (Signed)
Dr. Eugenia Pancoastrimble made aware of pt K+ of 5.7

## 2016-01-03 NOTE — Progress Notes (Signed)
BiPAP applied by RRT with RN at bedside

## 2016-01-04 ENCOUNTER — Encounter (HOSPITAL_COMMUNITY): Payer: Self-pay | Admitting: *Deleted

## 2016-01-04 ENCOUNTER — Inpatient Hospital Stay (HOSPITAL_COMMUNITY): Payer: Medicare Other

## 2016-01-04 LAB — BASIC METABOLIC PANEL
Anion gap: 9 (ref 5–15)
BUN: 81 mg/dL — AB (ref 6–20)
CHLORIDE: 112 mmol/L — AB (ref 101–111)
CO2: 21 mmol/L — AB (ref 22–32)
CREATININE: 2.92 mg/dL — AB (ref 0.61–1.24)
Calcium: 8 mg/dL — ABNORMAL LOW (ref 8.9–10.3)
GFR calc non Af Amer: 20 mL/min — ABNORMAL LOW (ref >60)
GFR, EST AFRICAN AMERICAN: 23 mL/min — AB (ref >60)
Glucose, Bld: 204 mg/dL — ABNORMAL HIGH (ref 65–99)
Potassium: 4.6 mmol/L (ref 3.5–5.1)
Sodium: 142 mmol/L (ref 135–145)

## 2016-01-04 LAB — GLUCOSE, CAPILLARY
Glucose-Capillary: 184 mg/dL — ABNORMAL HIGH (ref 65–99)
Glucose-Capillary: 188 mg/dL — ABNORMAL HIGH (ref 65–99)
Glucose-Capillary: 219 mg/dL — ABNORMAL HIGH (ref 65–99)

## 2016-01-04 LAB — CBC
HEMATOCRIT: 24.6 % — AB (ref 39.0–52.0)
HEMOGLOBIN: 7.8 g/dL — AB (ref 13.0–17.0)
MCH: 26.2 pg (ref 26.0–34.0)
MCHC: 31.7 g/dL (ref 30.0–36.0)
MCV: 82.6 fL (ref 78.0–100.0)
Platelets: 178 10*3/uL (ref 150–400)
RBC: 2.98 MIL/uL — ABNORMAL LOW (ref 4.22–5.81)
RDW: 18.3 % — ABNORMAL HIGH (ref 11.5–15.5)
WBC: 12.1 10*3/uL — ABNORMAL HIGH (ref 4.0–10.5)

## 2016-01-04 MED ORDER — DEXAMETHASONE SODIUM PHOSPHATE 10 MG/ML IJ SOLN: 20.0000 mg | INTRAMUSCULAR | Status: DC

## 2016-01-04 MED ORDER — PIPERACILLIN-TAZOBACTAM 3.375 G IVPB
3.3750 g | Freq: Three times a day (TID) | INTRAVENOUS | Status: DC
  Filled 2016-01-04 (×3): qty 50

## 2016-01-04 MED ORDER — MIDAZOLAM HCL 2 MG/2ML IJ SOLN
2.0000 mg | INTRAMUSCULAR | Status: DC | PRN
  Administered 2016-01-04 (×3): 2 mg via INTRAVENOUS
  Filled 2016-01-04 (×3): qty 2

## 2016-01-04 MED ORDER — DEXTROSE 5 % IV SOLN
10.0000 mg/h | INTRAVENOUS | Status: DC
  Administered 2016-01-04: 10 mg/h via INTRAVENOUS
  Filled 2016-01-04: qty 10

## 2016-01-04 MED ORDER — VANCOMYCIN HCL 10 G IV SOLR: 1250.0000 mg | INTRAVENOUS | Status: DC

## 2016-01-04 MED ORDER — VANCOMYCIN HCL 10 G IV SOLR
1500.0000 mg | Freq: Once | INTRAVENOUS | Status: DC
  Administered 2016-01-04: 1500 mg via INTRAVENOUS
  Filled 2016-01-04: qty 1500

## 2016-01-04 MED ORDER — MORPHINE BOLUS VIA INFUSION
5.0000 mg | INTRAVENOUS | Status: DC | PRN
  Administered 2016-01-04: 10 mg via INTRAVENOUS
  Administered 2016-01-04: 5 mg via INTRAVENOUS
  Administered 2016-01-04: 15 mg via INTRAVENOUS
  Filled 2016-01-04 (×4): qty 20

## 2016-01-06 LAB — CULTURE, BLOOD (ROUTINE X 2)
CULTURE: NO GROWTH
Culture: NO GROWTH

## 2016-01-06 LAB — TROPONIN I: Troponin I: 1.33 ng/mL (ref <0.031)

## 2016-01-08 ENCOUNTER — Telehealth: Payer: Self-pay

## 2016-01-08 NOTE — Telephone Encounter (Signed)
On 01/08/2016 I received a death certificate from [employer] (original). The death certificate is for burial. The patient is a patient of Edward QualiaJose Angela A de Dios. The [id number] this pm for signature.  On 01/12/2016 I received the death certificate back from [facility]. I got the death certificate ready and called the funeral home to let them know I was [facility] the [facility] per their request.

## 2016-01-31 NOTE — Progress Notes (Addendum)
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    SUBJECTIVE:  Pt remained on bipap almost all day. Off bipap >> resp distress, RR in 40s. (-) cp. If he removes bipap with RUE, he desaturates and RR in the 40s. Needed restraint in RUE. (-) melena  VITAL SIGNS: BP 108/95 mmHg  Pulse 98  Temp(Src) 98.4 F (36.9 C) (Oral)  Resp 25  Wt 192 lb 0.3 oz (87.1 kg)  SpO2 100%  HEMODYNAMICS:    VENTILATOR SETTINGS: Vent Mode:  [-] BIPAP FiO2 (%):  [40 %] 40 % Set Rate:  [12 bmp-15 bmp] 12 bmp PEEP:  [5 cmH20] 5 cmH20  INTAKE / OUTPUT: I/O last 3 completed shifts: In: 1132.4 [I.V.:1132.4] Out: 2265 [Urine:2265]  PHYSICAL EXAMINATION: General: Elderly male in NAD confused. Denies cp. In distress off bipap; less distress on bipap  Neuro: Awake, alert, moves right side spontaneously, Left hemiparesis  HEENT: MM pink/moist, no jvd Cardiovascular: s1s2 irregular, SR with PAC's on monitor Lungs: Labored, more rhonchi in BLF.  Abdomen: Obese/soft, bsx4 active  Musculoskeletal: No acute deformities Gr 2 edema Skin: Pale, warm, dry, scattered petechiae noted LABS:  BMET  Recent Labs Lab 01/03/16 0133 01/03/16 0900 01/22/2016 0430  NA 137 138 142  K 5.7* 4.8 4.6  CL 107 106 112*  CO2 12* 21* 21*  BUN 66* 70* 81*  CREATININE 2.57* 2.60* 2.92*  GLUCOSE 371* 240* 204*    Electrolytes  Recent Labs Lab 07/10/2016 2137 01/02/16 0340 01/03/16 0133 01/03/16 0900 01/22/2016 0430  CALCIUM 8.3* 8.3* 8.0* 8.1* 8.0*  MG 2.0 1.9  --   --   --   PHOS 2.4* 3.0  --   --   --     CBC  Recent Labs Lab 01/02/16 2015 01/03/16 0900 01/25/2016 0430  WBC 29.1* 17.9* 12.1*  HGB 8.4*  7.5* 7.8*  HCT 26.1* 22.9* 24.6*  PLT 171 169 178    Coag's  Recent Labs Lab 07/10/2016 0822  APTT 29  INR 1.37    Sepsis Markers No results for input(s): LATICACIDVEN, PROCALCITON, O2SATVEN in the last 168 hours.  ABG  Recent Labs Lab 01/03/16 0210  PHART 7.227*  PCO2ART 31.6*  PO2ART 96.0    Liver Enzymes  Recent Labs Lab 07/10/2016 0822  AST 29  ALT 22  ALKPHOS 45  BILITOT 0.5  ALBUMIN 3.3*    Cardiac Enzymes  Recent Labs Lab 01/03/16 0133 01/03/16 0536 01/03/16 0900  TROPONINI 1.33* 3.02* 4.36*    Glucose  Recent Labs Lab 01/03/16 0837 01/03/16 1209 01/03/16 1621 01/03/16 1957 01/03/2016 0004 01/14/2016 0421  GLUCAP 221* 239* 297* 233* 219* 184*    Imaging No results found.  STUDIES:  CT head 3/3> cancelled   CULTURES: BCx2 3/2 >> P UA 3/2 >> (-) MRSA (-)  ANTIBIOTICS: Not on any  SIGNIFICANT EVENTS: 3/02 [facility], started on IVIg 3/03 Went into distress with IVIG; troponin elevated.   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 Pt with worsening resp status over 48 hrs. Low grade fever.   ASSESSMENT / PLAN:  PULMONARY  A: Hx COPD, Former Tobacco Abuse  Pulm edema related to IVIG Concern for HCAP.  P:  Cont bipap 17/5 40% CXR today. Start zosyn, vanc for possible HCAP.  PRN albuterol for wheezing  S/P Lasix -- not clinically improved with lasix.    CARDIOVASCULAR A:  ACS, demand ischemia Hx HTN, HLD PAC's QTc prolonged  P:  Cards consulted; supportive management. Pt with CKD -- cant get LHC now Await echo Tele monitoring   RENAL A:  Acute Kidney Injury - in setting of ABL  P:  Trend BMP / UOP  Replace electrolytes as indicated  KVO  GASTROINTESTINAL A:  GIB - acute hematemesis > resolved P:  Start TF via dubhoff Change Protonix to  bid   HEMATOLOGIC A:  Anemia  Thrombocytopenia - ITP P:  IVIg per hematology -- will hold off on IVIG for now. plt is better.  Continue decadron Transfuse PRB for Hgb < 7 gm/dL Decrease decadron to 1/2 dose -- may also cause delirium  INFECTIOUS A:  R/O HCAP. Worsening resp status over 48 hrs P:  Start zosyn, vanc.   ENDOCRINE A:  DM II Hypothyroidism  Hyperglycemia on steroids P:  Monitor CBG / glucose on BMP  Q4 CBG SSI increase to resistant scale Continue synthroid   NEUROLOGIC A:  Hx CVA - on plavix at baseline  Depression / Anxiety  Delirium P:  RASS goal: 0 Cont  Precedex Hold plavix -- consider resuming in next 24-48 hrs Restart effexor, continue to hold trazodone, lyrica, clexa CT head if no improvement today after holding precedex   FAMILY  - No family at bedside - Inter-disciplinary family meet or Palliative Care meeting due by: 01/08/16   Pt is DNR.   Critical Care time spent today : 30 minutes.     Pollie Meyer, MD Pulmonary and Critical Care Medicine [facility] Pager: [phone] After 3 pm or if no response, call [phone]  01/16/2016, 7:11 AM   01/24/2016 addendum: spoke to granddaughter re: over all prognosis. Pt with multim organ fx. He is dnr. Pt may not want bipap, etc. Granddaughter will speak with mother (nurse) and decide on goals of care.   01/06/2016 9am spoke with pts daughter Frank Gentry. Updated her of pts over all condition and prognosis. She said pt did NOT want bipap, feeding tube, etc.  She will most likely make pt comfort care. She and brother and rest of family will come over and make him comfort care later today.   01/23/2016 1042 am Spoke with pam and ricky (children) and they want to withdraw support. Want to make pt comfort care per pts wishes. Will contact organ donation. Initiate morphine drip.

## 2016-01-31 NOTE — Progress Notes (Signed)
Pharmacy Antibiotic Note  Abigail MiyamotoRoy L Penza is a 75 y.o. male admitted on 01/19/2016 with tachypnea, possible HCAP.  Pharmacy has been consulted for Vancomycin and Zosyn dosing.  Plan: Vancomycin 1500 mg IV now, then Vancomycin 1250 mg IV q48h Zosyn 3.375 g IV q8h   Weight: 192 lb 0.3 oz (87.1 kg)  Temp (24hrs), Avg:99 F (37.2 C), Min:98.4 F (36.9 C), Max:100 F (37.8 C)   Recent Labs Lab 2016-01-14 2137 01/02/16 0340 01/02/16 0950 01/02/16 2015 01/03/16 0133 01/03/16 0900 01/11/2016 0430  WBC  --  15.3* 11.6* 29.1*  --  17.9* 12.1*  CREATININE 2.39* 2.47*  --   --  2.57* 2.60* 2.92*    Estimated Creatinine Clearance: 23.9 mL/min (by C-G formula based on Cr of 2.92).    No Known Allergies  Antimicrobials this admission: Vancomycin 3/5 >>  Zosyn 3/5 >>   Eddie Candlebbott, Zedric Deroy Vernon 01/30/2016 7:36 AM

## 2016-01-31 NOTE — Progress Notes (Signed)
Pt given holiday from BiPAP. Placed on 2L [place]. RN will continue to monitor RR status closely.

## 2016-01-31 NOTE — Progress Notes (Deleted)
This note was made in error. Pls see discharge summary done.

## 2016-01-31 NOTE — Progress Notes (Signed)
Pt observed breathless, pulseless, unresponsive, family at the bedside, verified by our charge nurse Clarita CraneLourdes, informed the on call MD critical care unit Dr. Kandyce RudAlva, [employer] tel [phone], nurse may pronounce death, morphine drip wasted with witness Clarita CraneLourdes, Charity fundraiser[employer].

## 2016-01-31 NOTE — Progress Notes (Signed)
Patient was taken off of Bipap by RN at 11:50 and was placed on comfort care measures.  Currently on room air.  Will no longer need bipap.  Will pull machine out of room once family has time to visit with patient.

## 2016-01-31 NOTE — Progress Notes (Signed)
Spoke with Cathlean CowerLesley RN regarding PICC placement.  Notified to obtain another CXR with improved positioning of patient -upright without rotation of torso- to compare PICC tip position prior to retraction of PICC.  PICC placed by ECG technology 01-03-16.

## 2016-01-31 NOTE — Discharge Summary (Addendum)
Name:Frank Gentry Radell AOZ:[id number] DOB:Sep 11, 1941   ADMISSION DATE: 01/02/2016 CONSULTATION DATE: 12/31/2015 Date of discharge: 01/06/2016.  REFERRING MD: Dr. Craige CottaSood  Consultants: Cardiology Svc with Dr. Royann Shiversroitoru; GI Svc with Dr. Carman ChingJames Edwards.   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  He reports he was in his usual state of health on 12/31/15. Normal eating / drinking habits. He woke on the am of 3/2 with abdominal discomfort and bright red vomiting. He reports approximately 3-4 episodes of vomiting. [facility] and later noted to have melena (guiac positive). Initial labs - WBC 9.6, Hgb 7 (03/2015 12.8), MCV 72, Platelets <5, Na 135, K 5.4, CL 98, BUN 62 / Sr Cr 2.17, glucose 297, & LDH 152. He was hypertensive requiring treatment with labetalol. Initial CXR was negative for acute infiltrates. He was treated with protonix / octreotide gtts.   The transferring hospital did not have platelets and was [facility].   Patient was transferred to  Facilitate IVIG. He received 2 rounds of IVIG. He had respiratory distress on admission which was more apparent with IVIG infusion. IVIG was stopped during both times because of worsening distress. He was diuresed during both times. With worsening distress, he was placed on BiPAP. Despite BiPAP, he remained to be tachypneic and in respiratory distress. GI service was consulted. His bleeding stabilized. No further bleeding noted. Patient also ended up having acute coronary syndrome. Cardiology service was consulted. There was concern about healthcare associated pneumonia. He was  started on broad-spectrum antibiotics. With worsening respiratory status, and multiple organ failure, patient was made from DO NOT RESUSCITATE to comfort care. Family decided to make patient comfort care on March 5. I had a meeting with both children and patient did not want resuscitation. BiPAP was discontinued and he was transferred out of the unit to the floors. He was pronounced on January 04 2016 at 10:04 PM. Family was at bedside.   PAST MEDICAL HISTORY :  He  has a past medical history of Essential hypertension; Hyperlipidemia; Type II diabetes with long term use of insulin (HCC); History of CVA (cerebrovascular accident); COPD (chronic obstructive pulmonary disease) (HCC); BPH (benign prostatic hyperplasia); Depression; Anxiety; and Hypothyroidism.  PAST SURGICAL HISTORY: He  has no past surgical history on file.  No Known Allergies  No current facility-administered medications on file prior to encounter.   Current Outpatient Prescriptions on File Prior to Encounter  Medication Sig  . acetaminophen (TYLENOL) 325 MG tablet Take 650 mg by mouth every 4 (four) hours as needed.  Marland Kitchen. acetaminophen (TYLENOL) 500 MG tablet Take 500 mg by mouth daily.  Marland Kitchen. amLODipine (NORVASC) 2.5 MG tablet Take 1 tablet by mouth daily.  . camphor-menthol (SARNA) lotion Apply 1 application topically 2 (two) times daily.  . cetirizine (ZYRTEC) 10 MG tablet Take 10 mg by mouth at bedtime.  . citalopram (CELEXA) 20 MG tablet Take 1 tablet by mouth daily.  . cloNIDine (CATAPRES) 0.2 MG tablet Take 1 tablet by mouth at bedtime.   . clopidogrel (PLAVIX) 75 MG tablet Take 1 tablet by mouth daily.  . clotrimazole-betamethasone (LOTRISONE) cream Apply 1 application topically 2 (two) times daily.   .Marland Kitchen  coal tar (NEUTROGENA T-GEL) 0.5 % shampoo Apply 1 application topically 2 (two) times a week.  . COMBIVENT RESPIMAT 20-100 MCG/ACT AERS respimat Inhale 1 puff into the lungs 3 (three)  times daily as needed.   . doxepin (SINEQUAN) 10 MG capsule Take 1 capsule by mouth every 12 (twelve) hours as needed.   . fenofibrate (TRICOR) 48 MG tablet Take 2 tablets by mouth daily.   . finasteride (PROSCAR) 5 MG tablet Take 1 tablet by mouth daily.  . fluticasone (FLONASE) 50 MCG/ACT nasal spray Place 2 sprays into both nostrils daily as needed for allergies or rhinitis.  Marland Kitchen guaifenesin (ROBITUSSIN) 100 MG/5ML syrup Take 200 mg by mouth every 4 (four) hours as needed for cough.  Marland Kitchen HUMALOG MIX 75/25 (75-25) 100 UNIT/ML SUSP injection Inject 55-100 Units into the skin 2 (two) times daily. Pt. Takes 100 units at 0800 and 55 units at 1700  . Hydrocortisone (GERHARDT'S BUTT CREAM) CREA Apply 1 application topically as needed for irritation.  . hydrOXYzine (ATARAX/VISTARIL) 10 MG tablet Take 10 mg by mouth every 6 (six) hours as needed.  Marland Kitchen levothyroxine (SYNTHROID, LEVOTHROID) 25 MCG tablet Take 1 tablet by mouth daily.  Marland Kitchen lisinopril (PRINIVIL,ZESTRIL) 20 MG tablet Take 20 mg by mouth 2 (two) times daily.   Marland Kitchen LYRICA 25 MG capsule Take 25 mg by mouth every 12 (twelve) hours.   . polyvinyl alcohol-povidone (HYPOTEARS) 1.4-0.6 % ophthalmic solution Place 2 drops into both eyes 4 (four) times daily as needed.  . potassium chloride (K-DUR,KLOR-CON) 10 MEQ tablet Take 10 mEq by mouth daily.  . pravastatin (PRAVACHOL) 20 MG tablet Take 1 tablet by mouth at bedtime.   . senna-docusate (SENOKOT-S) 8.6-50 MG tablet Take 1 tablet by mouth 2 (two) times daily.  . Skin Protectants, Misc. (EUCERIN) cream Apply 1 application topically 2 (two) times daily as needed for dry skin.  . SUNSCREENS EX Apply 1 application topically daily. Spf 70  . tamsulosin (FLOMAX) 0.4 MG CAPS capsule Take 1 capsule by mouth daily.  Marland Kitchen torsemide (DEMADEX) 10 MG tablet Take 15 mg by mouth daily.   . traZODone (DESYREL) 50 MG tablet Take 1 tablet by mouth at bedtime.  .  triamcinolone ointment (KENALOG) 0.1 % Apply 1 application topically 2 (two) times daily as needed.  . venlafaxine XR (EFFEXOR-XR) 75 MG 24 hr capsule Take 1 capsule by mouth daily.  . vitamin B-12 (CYANOCOBALAMIN) 1000 MCG tablet Take 1,000 mcg by mouth daily.    FAMILY HISTORY:  His indicated that his mother is deceased. He indicated that his father is deceased. He indicated that his brother is deceased.   SOCIAL HISTORY: He  reports that he has quit smoking. His smoking use included Cigarettes. He has a 30 pack-year smoking history. He does not have any smokeless tobacco history on file. He reports that he does not drink alcohol or use illicit drugs.     Discharge diagnoses: 1. Acute hypoxemic respiratory failure, likely secondary to pulmonary edema, acute coronary syndrome, possible healthcare associated pneumonia. 2. Acute kidney injury. On top of chronic kidney disease. 3. Upper GI bleed 3. ITP 4. Acute coronary syndrome, likely demand ischemia. 5. COPD 6. Delirium, related to above. H/O previous CVA.

## 2016-01-31 DEATH — deceased

## 2017-10-16 IMAGING — CR DG CHEST 1V PORT
1 series · 1 of 1 positions shown · non-contrast
Comparison: 10/31/2009

CLINICAL DATA: Hematemesis and hematochezia

EXAM:
PORTABLE CHEST 1 VIEW

[ap]
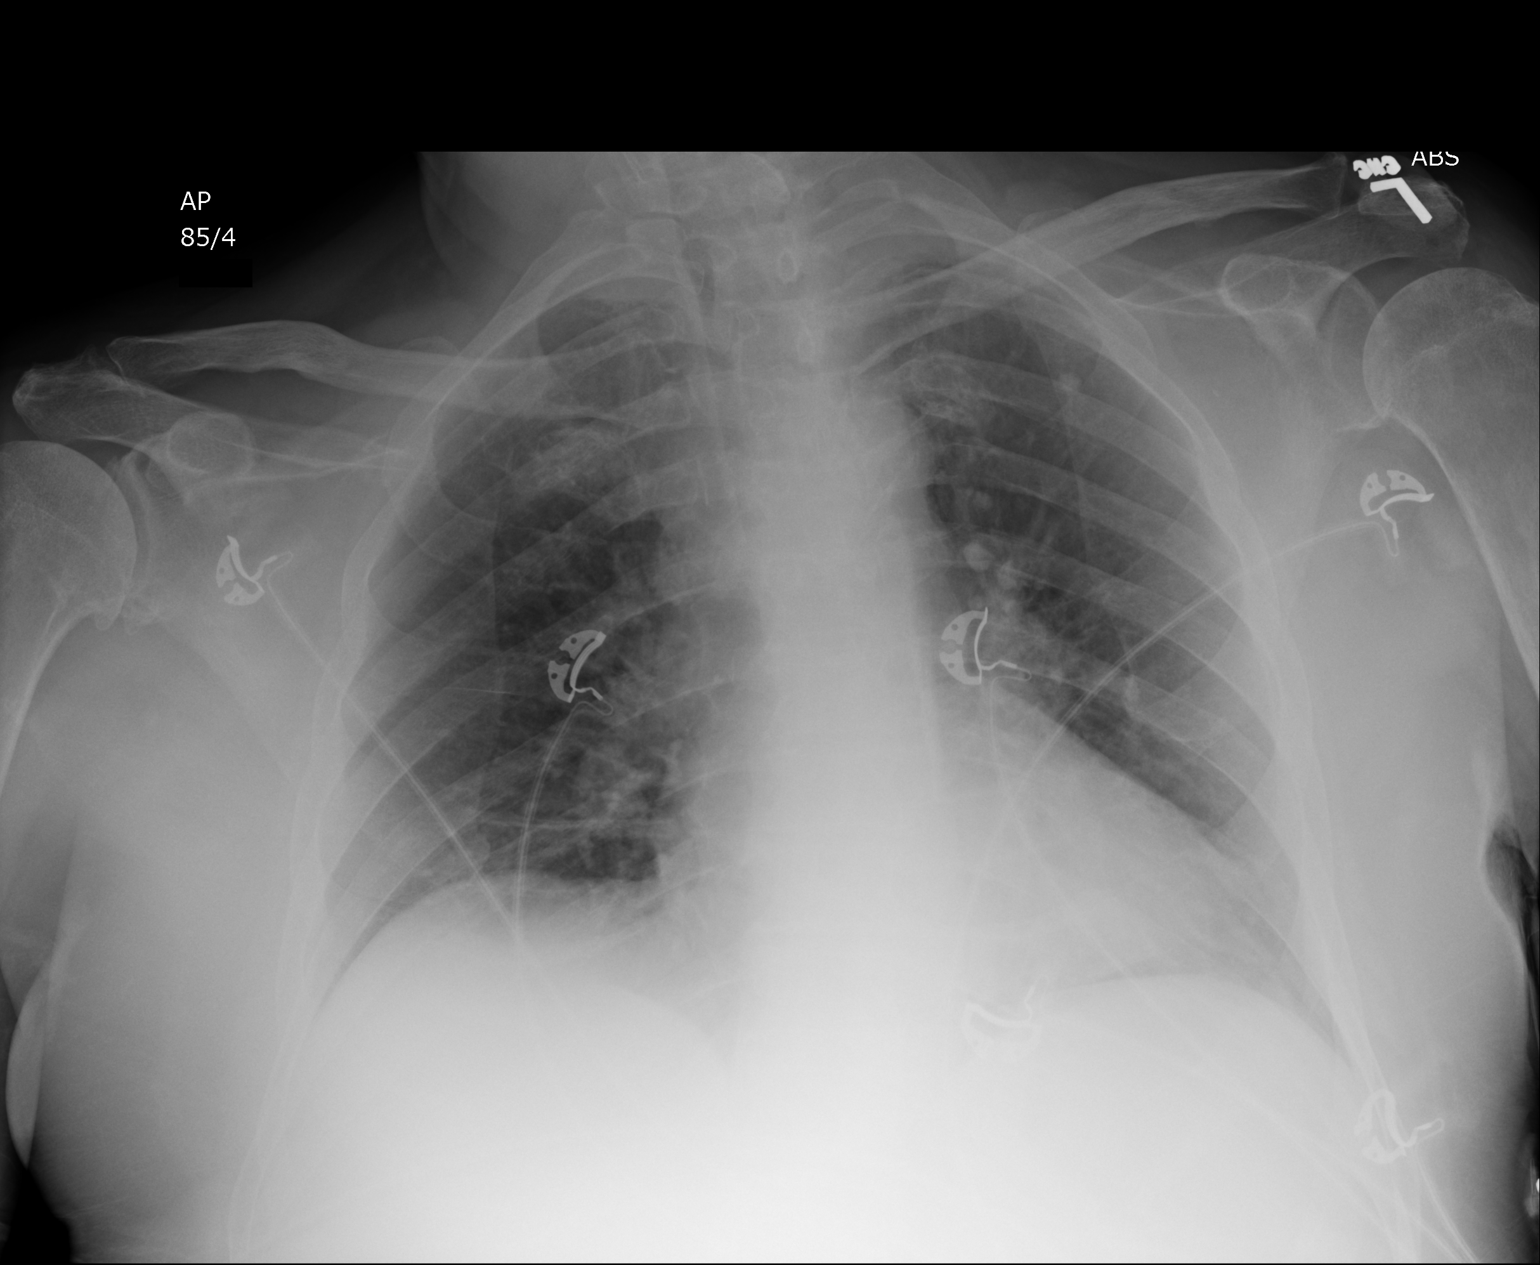

[1 of 1 positions shown; findings below may reference images not displayed]

FINDINGS: Cardiac shadow is within normal limits. The lungs are well aerated
bilaterally. No focal infiltrate or sizable effusion is seen. No
acute bony abnormality is noted. Calcified granuloma is again noted
in the left apex.
IMPRESSION: No active disease.

## 2017-10-18 IMAGING — CR DG CHEST 1V PORT
1 series · 1 of 1 positions shown · non-contrast
Comparison: Radiograph dated 01/02/2016

CLINICAL DATA: 24-year-old male with respiratory distress

EXAM:
PORTABLE CHEST 1 VIEW

[AP]
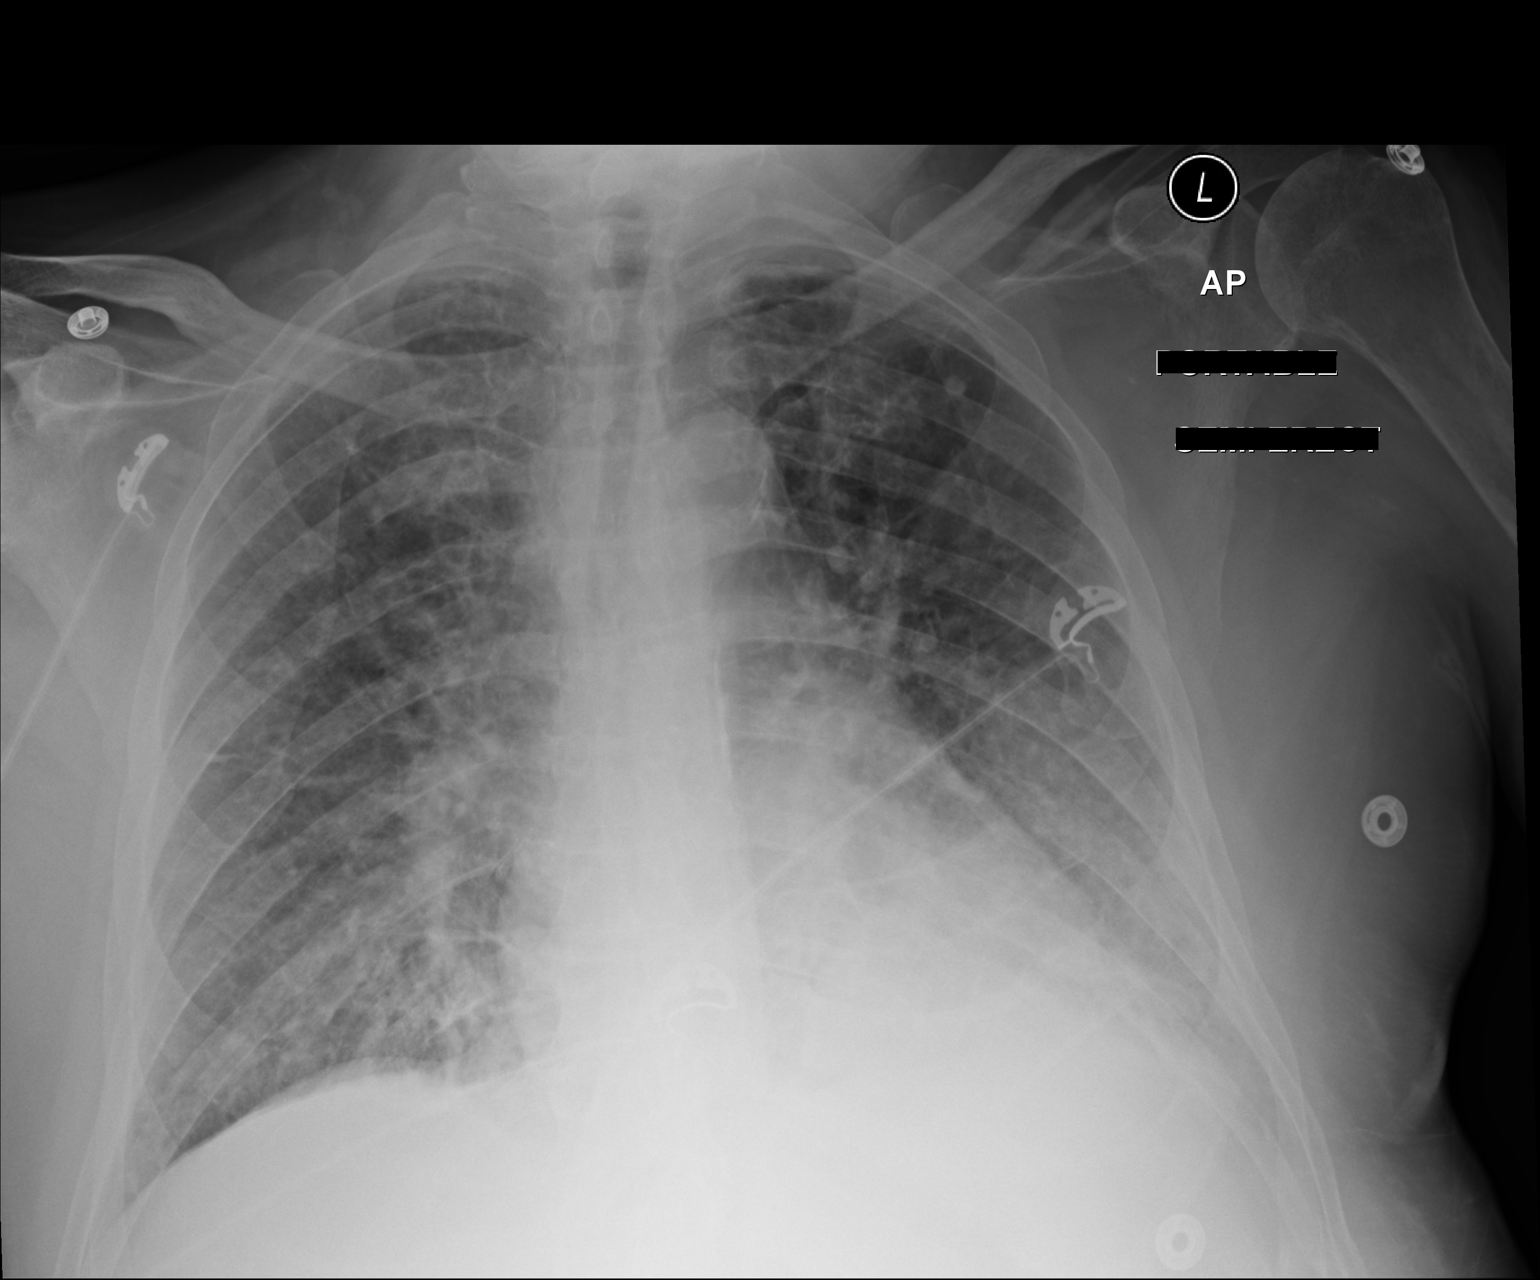

[1 of 1 positions shown; findings below may reference images not displayed]

FINDINGS: Single portable view of the chest demonstrate mild cardiomegaly.
There is diffuse interstitial nodular prominence, new or worsened
since the prior study, and likely representing interstitial edema
versus less likely congestive changes. An area of slightly increased
density at the left lung base noted. Superimposed pneumonia is not
excluded. Trace left pleural effusion may be present. There is
apparent narrowing of the transverse diameter of the trachea
suggestive of underlying chronic obstructive lung disease. No acute
osseous pathology identified.
IMPRESSION: Mild cardiomegaly with findings of interstitial edema versus less
likely congestive changes. Superimposed pneumonia is not excluded.
Clinical correlation and follow-up is recommended.

## 2017-10-19 IMAGING — DX DG CHEST 1V PORT
1 series · 1 of 1 positions shown · non-contrast
Comparison: Portable exam 5751 hours compared to 01/03/2016

CLINICAL DATA: Pneumonia, history portable exam 5751 hours compared
01/03/2016 hypertension, hyperlipidemia, type II diabetes mellitus,
stroke, COPD, former smoker essential

EXAM:
PORTABLE CHEST 1 VIEW

[chest ap]
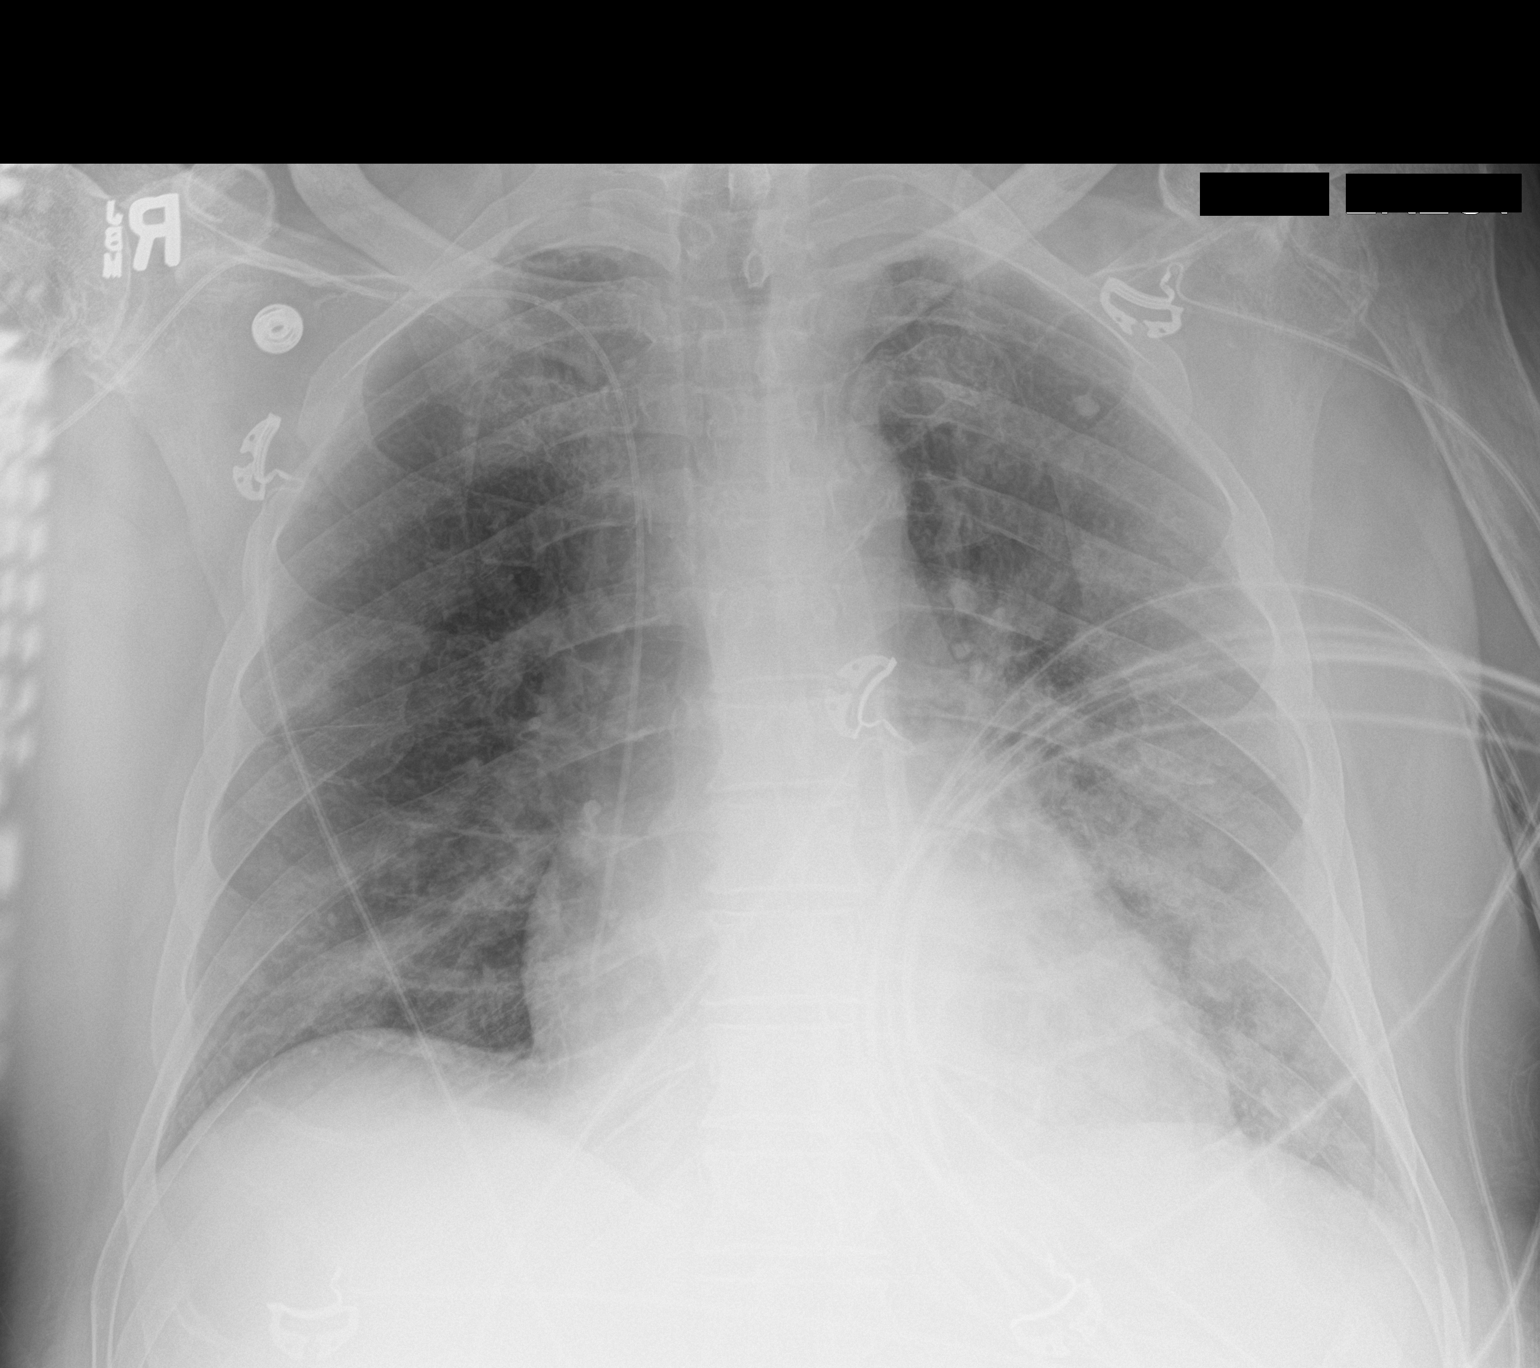

[1 of 1 positions shown; findings below may reference images not displayed]

FINDINGS: New RIGHT arm PICC line with tip projecting over RIGHT atrium;
recommend withdrawal 3.5 cm to place tip at cavoatrial junction.

Enlargement of cardiac silhouette. Mediastinal contours and
pulmonary vascularity normal.

Calcified granuloma LEFT upper lobe.

Persistent LEFT lower lobe infiltrate.

Remaining lung is demonstrate improved aeration since previous exam.

No gross pleural effusion or pneumothorax.

Bones demineralized.
IMPRESSION: Persistent infiltrate in LEFT lower lobe question residual
asymmetric edema versus infection.

New RIGHT arm PICC line tip projects over RIGHT atrium; recommend
withdrawal 3.5 cm.

Findings called to patient's nurse Biffi RN on 2M Medical ICU on
